# Patient Record
Sex: Female | Born: 1980 | Race: White | Hispanic: No | Marital: Single | Smoking: Never smoker
Health system: Southern US, Community
[De-identification: ages and names within clinical notes are randomized; demographics above are authoritative.]

---

## 2009-05-27 ENCOUNTER — Ambulatory Visit: Payer: Self-pay | Admitting: Internal Medicine

## 2009-05-27 ENCOUNTER — Inpatient Hospital Stay (HOSPITAL_COMMUNITY): Admission: EM | Admit: 2009-05-27 | Discharge: 2009-05-30 | Payer: Self-pay | Admitting: Emergency Medicine

## 2010-09-20 LAB — CBC
HCT: 34.8 % — ABNORMAL LOW (ref 36.0–46.0)
Hemoglobin: 15.4 g/dL — ABNORMAL HIGH (ref 12.0–15.0)
MCHC: 34.4 g/dL (ref 30.0–36.0)
MCV: 92.3 fL (ref 78.0–100.0)
MCV: 92.5 fL (ref 78.0–100.0)
MCV: 92.5 fL (ref 78.0–100.0)
Platelets: 171 10*3/uL (ref 150–400)
RBC: 3.77 MIL/uL — ABNORMAL LOW (ref 3.87–5.11)
RBC: 4.22 MIL/uL (ref 3.87–5.11)
RBC: 4.95 MIL/uL (ref 3.87–5.11)
RDW: 14.2 % (ref 11.5–15.5)
RDW: 14.5 % (ref 11.5–15.5)
WBC: 21 10*3/uL — ABNORMAL HIGH (ref 4.0–10.5)
WBC: 4.5 10*3/uL (ref 4.0–10.5)
WBC: 9.2 10*3/uL (ref 4.0–10.5)

## 2010-09-20 LAB — URINE CULTURE: Colony Count: NO GROWTH

## 2010-09-20 LAB — BASIC METABOLIC PANEL
BUN: 9 mg/dL (ref 6–23)
CO2: 18 mEq/L — ABNORMAL LOW (ref 19–32)
Calcium: 7.8 mg/dL — ABNORMAL LOW (ref 8.4–10.5)
Chloride: 111 mEq/L (ref 96–112)
Creatinine, Ser: 1 mg/dL (ref 0.4–1.2)
GFR calc Af Amer: >60 mL/min (ref >60)
Glucose, Bld: 107 mg/dL — ABNORMAL HIGH (ref 70–99)

## 2010-09-20 LAB — DIFFERENTIAL
Basophils Absolute: 0 10*3/uL (ref 0.0–0.1)
Basophils Relative: 0 % (ref 0–1)
Eosinophils Absolute: 0 10*3/uL (ref 0.0–0.7)
Eosinophils Absolute: 0 10*3/uL (ref 0.0–0.7)
Lymphs Abs: 0.6 10*3/uL — ABNORMAL LOW (ref 0.7–4.0)
Monocytes Absolute: 0 10*3/uL — ABNORMAL LOW (ref 0.1–1.0)
Monocytes Absolute: 0.4 10*3/uL (ref 0.1–1.0)
Monocytes Relative: 0 % — ABNORMAL LOW (ref 3–12)
Monocytes Relative: 2 % — ABNORMAL LOW (ref 3–12)
Neutrophils Relative %: 96 % — ABNORMAL HIGH (ref 43–77)
WBC Morphology: INCREASED

## 2010-09-20 LAB — LIPID PANEL
HDL: 32 mg/dL — ABNORMAL LOW (ref >39)
LDL Cholesterol: 36 mg/dL (ref 0–99)
Triglycerides: 30 mg/dL (ref <150)

## 2010-09-20 LAB — COMPREHENSIVE METABOLIC PANEL
ALT: 24 U/L (ref 0–35)
AST: 27 U/L (ref 0–37)
Albumin: 3.8 g/dL (ref 3.5–5.2)
Alkaline Phosphatase: 60 U/L (ref 39–117)
BUN: 19 mg/dL (ref 6–23)
CO2: 20 mEq/L (ref 19–32)
CO2: 23 mEq/L (ref 19–32)
Calcium: 7.4 mg/dL — ABNORMAL LOW (ref 8.4–10.5)
Chloride: 107 mEq/L (ref 96–112)
GFR calc non Af Amer: 42 mL/min — ABNORMAL LOW (ref >60)
GFR calc non Af Amer: 54 mL/min — ABNORMAL LOW (ref >60)
Glucose, Bld: 105 mg/dL — ABNORMAL HIGH (ref 70–99)
Potassium: 4.1 mEq/L (ref 3.5–5.1)
Sodium: 140 mEq/L (ref 135–145)
Total Bilirubin: 0.7 mg/dL (ref 0.3–1.2)
Total Bilirubin: 1.1 mg/dL (ref 0.3–1.2)

## 2010-09-20 LAB — CULTURE, BLOOD (ROUTINE X 2): Culture: NO GROWTH

## 2010-09-20 LAB — CALCIUM, URINE, RANDOM: Calcium, Ur: <1 mg/dL

## 2010-09-20 LAB — URINE MICROSCOPIC-ADD ON

## 2010-09-20 LAB — LACTIC ACID, PLASMA
Lactic Acid, Venous: 1.9 mmol/L (ref 0.5–2.2)
Lactic Acid, Venous: 6.7 mmol/L — ABNORMAL HIGH (ref 0.5–2.2)

## 2010-09-20 LAB — URINALYSIS, ROUTINE W REFLEX MICROSCOPIC
Bilirubin Urine: NEGATIVE
Specific Gravity, Urine: 1.014 (ref 1.005–1.030)
pH: 7.5 (ref 5.0–8.0)

## 2010-09-20 LAB — CK TOTAL AND CKMB (NOT AT ARMC): Total CK: 59 U/L (ref 7–177)

## 2010-09-20 LAB — OSMOLALITY, URINE: Osmolality, Ur: 326 mOsm/kg — ABNORMAL LOW (ref 390–1090)

## 2010-09-20 LAB — SODIUM, URINE, RANDOM: Sodium, Ur: 88 mEq/L

## 2011-01-19 ENCOUNTER — Other Ambulatory Visit (HOSPITAL_COMMUNITY)
Admission: RE | Admit: 2011-01-19 | Discharge: 2011-01-19 | Disposition: A | Discharge status: Home / Self Care | Payer: Medicare Other | Source: Ambulatory Visit | Attending: Obstetrics and Gynecology | Admitting: Obstetrics and Gynecology

## 2011-01-19 DIAGNOSIS — Z124 Encounter for screening for malignant neoplasm of cervix: Secondary | ICD-10-CM | POA: Insufficient documentation

## 2012-04-23 ENCOUNTER — Other Ambulatory Visit (HOSPITAL_COMMUNITY)
Admission: RE | Admit: 2012-04-23 | Discharge: 2012-04-23 | Disposition: A | Discharge status: Home / Self Care | Payer: Medicare Other | Source: Ambulatory Visit | Attending: Obstetrics and Gynecology | Admitting: Obstetrics and Gynecology

## 2012-04-23 DIAGNOSIS — Z124 Encounter for screening for malignant neoplasm of cervix: Secondary | ICD-10-CM | POA: Insufficient documentation

## 2012-09-17 ENCOUNTER — Telehealth: Payer: Self-pay | Admitting: Podiatry

## 2012-09-17 NOTE — Telephone Encounter (Signed)
Fax request for Pt..Ciclopirox 8 percent solution(Penlac)

## 2012-09-18 MED ORDER — CICLOPIROX 8 % EX SOLN: 1.0000 [drp] | Freq: Every day | CUTANEOUS | Status: DC

## 2012-10-23 ENCOUNTER — Encounter: Payer: Self-pay | Admitting: Podiatry

## 2012-10-23 ENCOUNTER — Ambulatory Visit (INDEPENDENT_AMBULATORY_CARE_PROVIDER_SITE_OTHER): Payer: Medicare Other | Admitting: Podiatry

## 2012-10-23 VITALS — BP 104/72 | HR 90 | Ht 61.0 in | Wt 120.0 lb

## 2012-10-23 DIAGNOSIS — M25571 Pain in right ankle and joints of right foot: Secondary | ICD-10-CM

## 2012-10-23 DIAGNOSIS — B351 Tinea unguium: Secondary | ICD-10-CM

## 2012-10-23 DIAGNOSIS — M25579 Pain in unspecified ankle and joints of unspecified foot: Secondary | ICD-10-CM | POA: Insufficient documentation

## 2012-10-23 NOTE — Progress Notes (Signed)
Subjective: 32 y.o. year old female patient presents accompanied by her care taker, for follow up on anti fungal treatment on right great toe nail. Patient lives in a group home. Still using Penlac daily and cleanse weakly.   Review of Systems - General ROS: negative for - chills, fatigue, fever, night sweats, sleep disturbance, weight gain or weight loss Ophthalmic ROS: negative ENT ROS: negative Allergy and Immunology ROS: negative Endocrine ROS: negative Breast ROS: negative for breast lumps Respiratory ROS: no cough, shortness of breath, or wheezing Cardiovascular ROS: no chest pain or dyspnea on exertion Gastrointestinal ROS: no abdominal pain, change in bowel habits, or black or bloody stools Musculoskeletal ROS: negative  Objective: Dermatologic: Thick yellow deformed nail on right great toe.   Vascular: Pedal pulses are all palpable. Orthopedic: Positive of mild bunion on left, asymptomatic.   Neurologic: All epicritic and tactile sensations grossly intact.  Assessment: Dystrophic mycotic nail right hallux shown improvement with Penlac treatment.  Treatment: All mycotic nails debrided.  Continue with Penlac treatment.  Return in 3 months or as needed.

## 2013-01-24 ENCOUNTER — Ambulatory Visit (INDEPENDENT_AMBULATORY_CARE_PROVIDER_SITE_OTHER): Payer: Medicare Other | Admitting: Podiatry

## 2013-01-24 DIAGNOSIS — B351 Tinea unguium: Secondary | ICD-10-CM

## 2013-01-24 NOTE — Progress Notes (Signed)
Subjective:  32 y.o. year old female patient presents accompanied by her care taker, for follow up on anti fungal treatment on right great toe nail.  Objective: Dermatologic: Thick yellow deformed nail on right great toe.  Vascular: Pedal pulses are all palpable.  Orthopedic: Positive of mild bunion on left, asymptomatic.  Neurologic: All epicritic and tactile sensations grossly intact.   Assessment:  Dystrophic mycotic nail right hallux shown improvement with Penlac treatment.   Treatment: All mycotic nails debrided.  Continue with Penlac treatment.  Return in 3 months or as needed.

## 2013-04-22 ENCOUNTER — Telehealth: Payer: Self-pay | Admitting: *Deleted

## 2013-04-22 NOTE — Telephone Encounter (Signed)
Dr. Raynald Kemp , Someone called for Myriam Jacobson and cancelled her appointment for this Friday 04/25/2013. They also requested we send a discontinue order for Toenail solution prescribed and fax to   Att: Cala Bradford @ [phone].

## 2013-04-23 ENCOUNTER — Encounter: Payer: Self-pay | Admitting: Podiatry

## 2013-04-25 ENCOUNTER — Ambulatory Visit: Payer: Medicare Other | Admitting: Podiatry

## 2014-04-22 LAB — PROCEDURE REPORT - SCANNED: PAP SMEAR: NEGATIVE

## 2014-11-11 ENCOUNTER — Ambulatory Visit: Payer: Medicare Other | Admitting: Podiatry

## 2014-12-14 ENCOUNTER — Ambulatory Visit (INDEPENDENT_AMBULATORY_CARE_PROVIDER_SITE_OTHER): Payer: Medicare Other | Admitting: Podiatry

## 2014-12-14 ENCOUNTER — Encounter: Payer: Self-pay | Admitting: Podiatry

## 2014-12-14 DIAGNOSIS — B351 Tinea unguium: Secondary | ICD-10-CM | POA: Diagnosis not present

## 2014-12-14 DIAGNOSIS — M79676 Pain in unspecified toe(s): Secondary | ICD-10-CM

## 2014-12-14 MED ORDER — KETOCONAZOLE 2 % EX CREA: 1.0000 "application " | TOPICAL_CREAM | Freq: Every day | CUTANEOUS | Status: DC

## 2014-12-14 NOTE — Progress Notes (Signed)
   Subjective:    Patient ID: Ferrari Zehm, female    DOB: 1981/05/22, 34 y.o.   MRN: [id number]  HPI 34 year old phenol presents the office today with complaints of painful, thick and, elongated toenails which she is able to trim herself. Denies any redness or drainage zone nail sites. She states that she has nail fungus which she suffers several years. She states that she has not other podiatrist and she presents been prescribed Penlac apparently however she does not remove her she used the medication or not. She also states that she has some itching in between her toes. No other complaints at this time in no acute changes since last appointment. Review of Systems  Endocrine: Positive for cold intolerance.       Objective:   Physical Exam Awake, alert, NAD, presents with caregiver DP/PT pulses palpable, CRT less than 3 seconds Protective sensation intact with Simms Weinstein monofilament, vibratory sensation intact, Achilles tendon reflex intact. Nails are hypertrophic, dystrophic, brittle, elongated, discolored 10. There is no surrounding erythema or drainage on nail sites. There is tenderness palpation overlying nails 1-5 bilaterally. There is dry, peeling skin interdigitally with slight erythematous base and subjectively the area does itch. No other areas of tenderness to bilateral lower extremity's. No overlying edema, erythema, increase in warmth bilaterally. MMT 5/5, ROM WNL No open lesions or pre-ulcerative lesions identified bilaterally No pain with calf compression, swelling, warmth, erythema.      Assessment & Plan:  34 year old female with symptomatic onychomycosis, tinea pedis -Treatment options discussed including all alternatives, risks, and complications -Nail sharply debrided 10 without complications/bleeding. Discussed. Treatment for onychomycosis. This time shows proceed with topical OTC treatment. Discussed fungi-nail or formula 3 -Prescribed  ketoconazole -Follow-up in 3 months or sooner if any problems are to arise. In the meantime I encouraged her to call the office with any questions, concerns, change in symptoms.  Ovid Curd, DPM

## 2014-12-14 NOTE — Patient Instructions (Signed)

## 2015-03-25 ENCOUNTER — Ambulatory Visit: Payer: Medicare Other | Admitting: Podiatry

## 2015-08-13 ENCOUNTER — Ambulatory Visit: Payer: Medicare Other | Admitting: Podiatry

## 2015-08-31 ENCOUNTER — Ambulatory Visit: Payer: Medicare Other | Admitting: Podiatry

## 2016-03-15 ENCOUNTER — Encounter: Payer: Self-pay | Admitting: *Deleted

## 2018-04-10 ENCOUNTER — Ambulatory Visit (INDEPENDENT_AMBULATORY_CARE_PROVIDER_SITE_OTHER): Payer: Medicare Other | Admitting: Podiatry

## 2018-04-10 VITALS — BP 112/77 | HR 108

## 2018-04-10 DIAGNOSIS — M79674 Pain in right toe(s): Secondary | ICD-10-CM

## 2018-04-10 DIAGNOSIS — M79675 Pain in left toe(s): Secondary | ICD-10-CM

## 2018-04-10 DIAGNOSIS — B353 Tinea pedis: Secondary | ICD-10-CM

## 2018-04-10 DIAGNOSIS — B351 Tinea unguium: Secondary | ICD-10-CM | POA: Diagnosis not present

## 2018-04-10 MED ORDER — CLOTRIMAZOLE 1 % EX CREA: TOPICAL_CREAM | CUTANEOUS | 1 refills | Status: DC

## 2018-04-10 NOTE — Progress Notes (Signed)
Subjective: Andrea Frank presents today for folllowup preventative foot care regarding painful mycotic toenails. She is from a group home and is accompanied by her caregiver on today's visit. When her toenails are elongated, she has pain aggravated when wearing enclosed shoe gear. This pain is relieved with periodic professional debridement.  ROS: Endocrine: Positive for cold intolerance  Objective: Vitals:   04/10/18 1107  BP: 112/77  Pulse: (!) 108   Examination: 37 y.o. Caucasian female, obese, in NAD. AA in NAD. Pleasant and cooperative.  Vascular Examination: Capillary refill time <3 seconds x 10 digits Dorsalis pedis and posterior tibial pulses present b/l No digital hair x 10 digits Skin temperature warm to warm b/l  Dermatological Examination: Skin with normal turgor and tone b/l Diffuse scaling noted plantar and peripheral aspect of both feet Toenails 1-5 b/l discolored, thick, dystrophic with subungual debris and pain with palpation to nailbeds due to thickness of nails.  Musculoskeletal: Muscle strength 5/5 to all LE muscle groups  Neurological: Sensation intact with 10 gram monofilament. Vibratory sensation intact.  Assessment: Painful onychomycosis toenails 1-5 b/l  Tinea pedis b/l  Plan: 1. Discussed tinea pedis and topical treatment options. Rx sent to pharmacy for Clotrimazole Cream 1% to be applied to both feet bid for 4 weeks. 2. Toenails 1-5 b/l were debrided in length and girth without iatrogenic bleeding. 3. Patient to continue soft, supportive shoe gear 4. Patient to report any pedal injuries to medical professional immediately. 5. Follow up 3 months. Patient/POA to call should there be a concern in the interim.

## 2018-04-11 ENCOUNTER — Encounter: Payer: Self-pay | Admitting: Podiatry

## 2018-07-11 ENCOUNTER — Ambulatory Visit: Payer: Medicare Other | Admitting: Podiatry

## 2018-07-11 ENCOUNTER — Other Ambulatory Visit: Payer: Self-pay | Admitting: Family Medicine

## 2018-07-11 DIAGNOSIS — Z1231 Encounter for screening mammogram for malignant neoplasm of breast: Secondary | ICD-10-CM

## 2018-08-14 ENCOUNTER — Ambulatory Visit
Admission: RE | Admit: 2018-08-14 | Discharge: 2018-08-14 | Disposition: A | Discharge status: Home / Self Care | Payer: Medicare Other | Source: Ambulatory Visit | Attending: Family Medicine | Admitting: Family Medicine

## 2018-08-14 DIAGNOSIS — Z1231 Encounter for screening mammogram for malignant neoplasm of breast: Secondary | ICD-10-CM

## 2018-12-10 ENCOUNTER — Encounter: Payer: Self-pay | Admitting: Podiatry

## 2018-12-10 ENCOUNTER — Ambulatory Visit (INDEPENDENT_AMBULATORY_CARE_PROVIDER_SITE_OTHER): Payer: Medicare Other | Admitting: Podiatry

## 2018-12-10 ENCOUNTER — Other Ambulatory Visit: Payer: Self-pay

## 2018-12-10 VITALS — Temp 98.0°F

## 2018-12-10 DIAGNOSIS — M79675 Pain in left toe(s): Secondary | ICD-10-CM | POA: Diagnosis not present

## 2018-12-10 DIAGNOSIS — B353 Tinea pedis: Secondary | ICD-10-CM

## 2018-12-10 DIAGNOSIS — M79674 Pain in right toe(s): Secondary | ICD-10-CM | POA: Diagnosis not present

## 2018-12-10 DIAGNOSIS — B351 Tinea unguium: Secondary | ICD-10-CM

## 2018-12-10 MED ORDER — CICLOPIROX OLAMINE 0.77 % EX CREA
TOPICAL_CREAM | Freq: Two times a day (BID) | CUTANEOUS | 0 refills | Status: DC
Start: 2018-12-10 — End: 2019-03-18

## 2018-12-10 NOTE — Patient Instructions (Signed)

## 2018-12-19 NOTE — Progress Notes (Signed)
Subjective:  Andrea Frank presents to clinic today with cc of  painful, thick, discolored, elongated toenails 1-5 b/l that become tender and cannot cut because of thickness. Pain is aggravated when wearing enclosed shoe gear.  Leonard Downing, MD is her PCP.   Medications reviewed.  No Known Allergies   Objective: Vitals:   12/10/18 1143  Temp: 98 F (36.7 C)    Physical Examination:  Vascular Examination: Capillary refill time <3 seconds x 10 digits.  Palpable DP/PT pulses b/l.  Digital hair absent b/l.  No edema noted b/l.  Skin temperature gradient WNL b/l.  Dermatological Examination: Skin with normal turgor, texture and tone b/l.  No open wounds b/l.  No interdigital macerations noted b/l.  Elongated, thick, discolored brittle toenails with subungual debris and pain on dorsal palpation of nailbeds 1-5 b/l.  Diffuse scaling noted peripherally and plantarly b/l feet with mild foot odor.  No interdigital macerations.  No blisters, no weeping. No signs of secondary bacterial infection noted.  Musculoskeletal Examination: Muscle strength 5/5 to all muscle groups b/l  No pain, crepitus or joint discomfort with active/passive ROM.  Neurological Examination: Sensation intact 5/5 b/l with 10 gram monofilament.  Vibratory sensation intact b/l.  Proprioceptive sensation intact b/l.  Assessment: Mycotic nail infection with pain 1-5 b/l Tinea pedis b/l.  Plan: 1. Toenails 1-5 b/l were debrided in length and girth without iatrogenic laceration. 2. For tinea pedis, will discontinue Clotrimazole Cream and start her on Loprox Cream 0.77% to be applied to both feet bid. 3.  Continue soft, supportive shoe gear daily. 4.   Report any pedal injuries to medical professional. 5.  Follow up 3 months. 6.  Patient/POA to call should there be a question/concern in there interim.

## 2019-01-04 ENCOUNTER — Encounter: Payer: Self-pay | Admitting: Emergency Medicine

## 2019-01-04 ENCOUNTER — Other Ambulatory Visit: Payer: Self-pay

## 2019-01-04 ENCOUNTER — Emergency Department
Admission: EM | Admit: 2019-01-04 | Discharge: 2019-01-04 | Disposition: A | Discharge status: Home / Self Care | Payer: Medicare Other | Attending: Emergency Medicine | Admitting: Emergency Medicine

## 2019-01-04 DIAGNOSIS — Z20828 Contact with and (suspected) exposure to other viral communicable diseases: Secondary | ICD-10-CM | POA: Diagnosis not present

## 2019-01-04 DIAGNOSIS — R1110 Vomiting, unspecified: Secondary | ICD-10-CM

## 2019-01-04 DIAGNOSIS — Z79899 Other long term (current) drug therapy: Secondary | ICD-10-CM | POA: Insufficient documentation

## 2019-01-04 LAB — POCT PREGNANCY, URINE: Preg Test, Ur: NEGATIVE

## 2019-01-04 LAB — SARS CORONAVIRUS 2 BY RT PCR (HOSPITAL ORDER, PERFORMED IN ~~LOC~~ HOSPITAL LAB): SARS Coronavirus 2: NEGATIVE

## 2019-01-04 NOTE — ED Triage Notes (Signed)
Patient from [facility] with caregiver due to history of emesis x 1 yesterday and x1 this am. Was able to eat lunch with no further emesis. Denies abdominal pain. Possible fever yesterday per caregiver. Afebrile at present.

## 2019-01-04 NOTE — ED Notes (Signed)
Pt ambulatory from triage, A&Ox4, reports 2 episodes of emesis since yesterday, NAD noted. Dr. Jari Pigg at bedside. Group home owner at bedside

## 2019-01-04 NOTE — Discharge Instructions (Signed)
Person Under Monitoring Name: Andrea Frank  Location: [place] [address]   Infection Prevention Recommendations for Individuals Confirmed to have, or Being Evaluated for, 2019 Novel Coronavirus (COVID-19) Infection Who Receive Care at Home  Individuals who are confirmed to have, or are being evaluated for, COVID-19 should follow the prevention steps below until a healthcare provider or local or state health department says they can return to normal activities.  Stay home except to get medical care You should restrict activities outside your home, except for getting medical care. Do not go to work, school, or public areas, and do not use public transportation or taxis.  Call ahead before visiting your doctor Before your medical appointment, call the healthcare provider and tell them that you have, or are being evaluated for, COVID-19 infection. This will help the healthcare providers office take steps to keep other people from getting infected. Ask your healthcare provider to call the local or state health department.  Monitor your symptoms Seek prompt medical attention if your illness is worsening (e.g., difficulty breathing). Before going to your medical appointment, call the healthcare provider and tell them that you have, or are being evaluated for, COVID-19 infection. Ask your healthcare provider to call the local or state health department.  Wear a facemask You should wear a facemask that covers your nose and mouth when you are in the same room with other people and when you visit a healthcare provider. People who live with or visit you should also wear a facemask while they are in the same room with you.  Separate yourself from other people in your home As much as possible, you should stay in a different room from other people in your home. Also, you should use a separate bathroom, if available.  Avoid sharing household items You should  not share dishes, drinking glasses, cups, eating utensils, towels, bedding, or other items with other people in your home. After using these items, you should wash them thoroughly with soap and water.  Cover your coughs and sneezes Cover your mouth and nose with a tissue when you cough or sneeze, or you can cough or sneeze into your sleeve. Throw used tissues in a lined trash can, and immediately wash your hands with soap and water for at least 20 seconds or use an alcohol-based hand rub.  Wash your [facility] your hands often and thoroughly with soap and water for at least 20 seconds. You can use an alcohol-based hand sanitizer if soap and water are not available and if your hands are not visibly dirty. Avoid touching your eyes, nose, and mouth with unwashed hands.   Prevention Steps for Caregivers and Household Members of Individuals Confirmed to have, or Being Evaluated for, COVID-19 Infection Being Cared for in the Home  If you live with, or provide care at home for, a person confirmed to have, or being evaluated for, COVID-19 infection please follow these guidelines to prevent infection:  Follow healthcare providers instructions Make sure that you understand and can help the patient follow any healthcare provider instructions for all care.  Provide for the patients basic needs You should help the patient with basic needs in the home and provide support for getting groceries, prescriptions, and other personal needs.  Monitor the patients symptoms If they are getting sicker, call his or her medical provider and tell them that the patient has, or is being evaluated for, COVID-19 infection. This will help the healthcare providers office  take steps to keep other people from getting infected. Ask the healthcare provider to call the local or state health department.  Limit the number of people who have contact with the patient If possible, have only one caregiver for the  patient. Other household members should stay in another home or place of residence. If this is not possible, they should stay in another room, or be separated from the patient as much as possible. Use a separate bathroom, if available. Restrict visitors who do not have an essential need to be in the home.  Keep older adults, very young children, and other sick people away from the patient Keep older adults, very young children, and those who have compromised immune systems or chronic health conditions away from the patient. This includes people with chronic heart, lung, or kidney conditions, diabetes, and cancer.  Ensure good ventilation Make sure that shared spaces in the home have good air flow, such as from an air conditioner or an opened window, weather permitting.  Wash your hands often Wash your hands often and thoroughly with soap and water for at least 20 seconds. You can use an alcohol based hand sanitizer if soap and water are not available and if your hands are not visibly dirty. Avoid touching your eyes, nose, and mouth with unwashed hands. Use disposable paper towels to dry your hands. If not available, use dedicated cloth towels and replace them when they become wet.  Wear a facemask and gloves Wear a disposable facemask at all times in the room and gloves when you touch or have contact with the patients blood, body fluids, and/or secretions or excretions, such as sweat, saliva, sputum, nasal mucus, vomit, urine, or feces.  Ensure the mask fits over your nose and mouth tightly, and do not touch it during use. Throw out disposable facemasks and gloves after using them. Do not reuse. Wash your hands immediately after removing your facemask and gloves. If your personal clothing becomes contaminated, carefully remove clothing and launder. Wash your hands after handling contaminated clothing. Place all used disposable facemasks, gloves, and other waste in a lined container before  disposing them with other household waste. Remove gloves and wash your hands immediately after handling these items.  Do not share dishes, glasses, or other household items with the patient Avoid sharing household items. You should not share dishes, drinking glasses, cups, eating utensils, towels, bedding, or other items with a patient who is confirmed to have, or being evaluated for, COVID-19 infection. After the person uses these items, you should wash them thoroughly with soap and water.  Wash laundry thoroughly Immediately remove and wash clothes or bedding that have blood, body fluids, and/or secretions or excretions, such as sweat, saliva, sputum, nasal mucus, vomit, urine, or feces, on them. Wear gloves when handling laundry from the patient. Read and follow directions on labels of laundry or clothing items and detergent. In general, wash and dry with the warmest temperatures recommended on the label.  Clean all areas the individual has used often Clean all touchable surfaces, such as counters, tabletops, doorknobs, bathroom fixtures, toilets, phones, keyboards, tablets, and bedside tables, every day. Also, clean any surfaces that may have blood, body fluids, and/or secretions or excretions on them. Wear gloves when cleaning surfaces the patient has come in contact with. Use a diluted bleach solution (e.g., dilute bleach with 1 part bleach and 10 parts water) or a household disinfectant with a label that says EPA-registered for coronaviruses. To make a bleach  solution at home, add 1 tablespoon of bleach to 1 quart (4 cups) of water. For a larger supply, add  cup of bleach to 1 gallon (16 cups) of water. Read labels of cleaning products and follow recommendations provided on product labels. Labels contain instructions for safe and effective use of the cleaning product including precautions you should take when applying the product, such as wearing gloves or eye protection and making sure you  have good ventilation during use of the product. Remove gloves and wash hands immediately after cleaning.  Monitor yourself for signs and symptoms of illness Caregivers and household members are considered close contacts, should monitor their health, and will be asked to limit movement outside of the home to the extent possible. Follow the monitoring steps for close contacts listed on the symptom monitoring form.   ? If you have additional questions, contact your local health department or call the epidemiologist on call at [phone] (available 24/7). ? This guidance is subject to change. For the most up-to-date guidance from [facility], please refer to their website: [email or web address]

## 2019-01-04 NOTE — ED Provider Notes (Signed)
[facility] Emergency Department Provider Note  ____________________________________________   First MD Initiated Contact with Patient 01/04/19 1750     (approximate)  I have reviewed the triage vital signs and the nursing notes.   HISTORY  Chief Complaint Emesis    HPI Andrea Frank is a 38 y.o. female with Down syndrome who stays at the [facility] who presents with caregiver due to vomiting. Patient had 1 episode of vomiting yesterday maybe 1 today.  Patient was able to eat lunch without further emesis.  Patient is not having any abdominal pain.  There was a report of some possibility of having a fever but unclear if this was accurate.  Patient was brought due to the concerns of caregiver that she may have coronavirus.  They are requesting coronavirus testing.  Patient herself says she is feeling fine.  Patient laughs when I tell her she is making my job too easy.  She denies any abdominal pain, urinary symptoms, chest pain, shortness of breath.     History reviewed. No pertinent past medical history.  Patient Active Problem List   Diagnosis Date Noted   Pain in joint, ankle and foot 10/23/2012   Onychomycosis due to dermatophyte 10/23/2012    History reviewed. No pertinent surgical history.  Prior to Admission medications   Medication Sig Start Date End Date Taking? Authorizing Provider  benzoyl peroxide-erythromycin [facility]) gel  08/19/18   [provider]  ciclopirox (LOPROX) 0.77 % cream Apply topically 2 (two) times daily. 12/10/18   Marzetta Board, DPM  ciclopirox (PENLAC) 8 % solution Apply 1 drop topically at bedtime. Apply over nail and surrounding skin. Apply daily over previous coat. After seven (7) days, may remove with alcohol and continue cycle. 09/18/12   Sheard, Myeong O, DPM  clindamycin-benzoyl peroxide (BENZACLIN) gel  11/27/18   [provider]  clotrimazole (LOTRIMIN) 1 % cream Apply to both  feet and between toes twice daily 04/10/18   Marzetta Board, DPM  [employer] Extract (T/GEL EXTRA STRENGTH EX) Apply topically.    [provider]  Ergocalciferol (VITAMIN D2 PO) Take by mouth. 2 day weekly    [provider]  hydrocortisone 2.5 % cream  11/27/18   [provider]  ketoconazole (NIZORAL) 2 % cream Apply 1 application topically daily. 12/14/14   Trula Slade, DPM  medroxyPROGESTERone (DEPO-PROVERA) 150 MG/ML injection Inject 150 mg into the muscle every 3 (three) months.  07/16/12   [provider]  Selenium Sulfide (SELSUN BLUE MEDICATED EX) Apply topically.    [provider]    Allergies Patient has no known allergies.  No family history on file.  Social History Social History   Tobacco Use   Smoking status: Never Smoker   Smokeless tobacco: Never Used  Substance Use Topics   Alcohol use: Not on file   Drug use: Not on file      Review of Systems Constitutional: No fever/chills Eyes: No visual changes. ENT: No sore throat. Cardiovascular: Denies chest pain. Respiratory: Denies shortness of breath. Gastrointestinal: No abdominal pain.  No nausea, vomiting that has resolved..  No diarrhea.  No constipation. Genitourinary: Negative for dysuria. Musculoskeletal: Negative for back pain. Skin: Negative for rash. Neurological: Negative for headaches, focal weakness or numbness. All other ROS negative ____________________________________________   PHYSICAL EXAM:  VITAL SIGNS: ED Triage Vitals  Enc Vitals Group     BP 01/04/19 1458 103/65     Pulse Rate 01/04/19 1458 (!)  104     Resp 01/04/19 1458 18     Temp 01/04/19 1458 98.5 F (36.9 C)     Temp Source 01/04/19 1458 Oral     SpO2 01/04/19 1458 98 %     Weight 01/04/19 1502 176 lb 12.9 oz (80.2 kg)     Height --      Head Circumference --      Peak Flow --      Pain Score 01/04/19 1502 0     Pain Loc --      Pain Edu? --      Excl. in GC?  --     Constitutional: Alert and oriented. Well appearing and in no acute distress.  Features consistent with known Down syndrome. Eyes: Conjunctivae are normal. EOMI. Head: Atraumatic. Nose: No congestion/rhinnorhea. Mouth/Throat: Mucous membranes are moist.   Neck: No stridor. Trachea Midline. FROM Cardiovascular: Slightly tachycardic.  Regular rhythm. Grossly normal heart sounds.  Good peripheral circulation. Respiratory: Normal respiratory effort.  No retractions. Lungs CTAB. Gastrointestinal: Soft and nontender. No distention. No abdominal bruits.  Musculoskeletal: No lower extremity tenderness nor edema.  No joint effusions. Neurologic:  Normal speech and language. No gross focal neurologic deficits are appreciated.  Skin:  Skin is warm, dry and intact. No rash noted. Psychiatric: Mood and affect are normal. Speech and behavior are normal. GU: Deferred   ____________________________________________   LABS (all labs ordered are listed, but only abnormal results are displayed)  Labs Reviewed  SARS CORONAVIRUS 2 (HOSPITAL ORDER, PERFORMED IN [facility])  POCT PREGNANCY, URINE    ____________________________________________   PROCEDURES  Procedure(s) performed (including Critical Care):  Procedures   ____________________________________________   INITIAL IMPRESSION / ASSESSMENT AND PLAN / ED COURSE  Andrea Frank was evaluated in Emergency Department on 01/04/2019 for the symptoms described in the history of present illness. She was evaluated in the context of the global COVID-19 pandemic, which necessitated consideration that the patient might be at risk for infection with the SARS-CoV-2 virus that causes COVID-19. Institutional protocols and algorithms that pertain to the evaluation of patients at risk for COVID-19 are in a state of rapid change based on information released by regulatory bodies including the CDC and federal and state organizations. These  policies and algorithms were followed during the patient's care in the ED.    Patient is very well-appearing and laughing.  Patient has no abdominal tenderness to suggest abdominal infection. No chest pain to suggest ACS. No SOB to suggest PE. Patient is tolerating p.o. here in the emergency room.  Patient was slightly tachycardic but again so well-appearing and repeat HR normal. Offered lab testing to evaluate for electrolyte abnormalities, dehydration, infection however given patient is well-appearing caregiver and I opted to hold off on IV. No fever here as well and has not had recent ibuprofen/tylenol. Caregiver is most interested in getting a coronavirus testing and is hoping to be able to leave before the results come back.   6:18 PM re-evaluated tolerating PO. Urine test negative. Continues to look well.  No abd pain. Low suspicion for acute pathology.   Cover test negative  I discussed the provisional nature of ED diagnosis, the treatment so far, the ongoing plan of care, follow up appointments and return precautions with the patient and any family or support people present. They expressed understanding and agreed with the plan, discharged home.          ____________________________________________   FINAL CLINICAL IMPRESSION(S) / ED DIAGNOSES  Final diagnoses:  Intractable vomiting, presence of nausea not specified, unspecified vomiting type      MEDICATIONS GIVEN DURING THIS VISIT:  Medications - No data to display   ED Discharge Orders    None       Note:  This document was prepared using Dragon voice recognition software and may include unintentional dictation errors.   Concha SeFunke, Sophy Mesler E, MD 01/05/19 [id number]

## 2019-01-04 NOTE — ED Notes (Signed)
Pt moved to rm4.

## 2019-02-18 ENCOUNTER — Telehealth: Payer: Self-pay | Admitting: *Deleted

## 2019-02-18 NOTE — Telephone Encounter (Signed)
[facility] - Ed states pt received Loprox prescription on 12/10/2018, and is in a adult care facility, they will not stop the product without the doctors order.

## 2019-02-21 ENCOUNTER — Telehealth: Payer: Self-pay | Admitting: Podiatry

## 2019-02-21 DIAGNOSIS — B353 Tinea pedis: Secondary | ICD-10-CM

## 2019-02-21 MED ORDER — CLOTRIMAZOLE 1 % EX CREA
TOPICAL_CREAM | CUTANEOUS | 0 refills | Status: DC
Start: 2019-02-21 — End: 2019-03-18

## 2019-02-21 NOTE — Addendum Note (Signed)
Addended by: Harriett Sine D on: 02/21/2019 01:06 PM   Modules accepted: Orders

## 2019-02-21 NOTE — Telephone Encounter (Signed)
Pt called requesting refill on clotrimazole 1% cream

## 2019-03-18 ENCOUNTER — Encounter: Payer: Self-pay | Admitting: Podiatry

## 2019-03-18 ENCOUNTER — Ambulatory Visit (INDEPENDENT_AMBULATORY_CARE_PROVIDER_SITE_OTHER): Payer: Medicare Other | Admitting: Podiatry

## 2019-03-18 ENCOUNTER — Other Ambulatory Visit: Payer: Self-pay

## 2019-03-18 DIAGNOSIS — M79675 Pain in left toe(s): Secondary | ICD-10-CM

## 2019-03-18 DIAGNOSIS — M79674 Pain in right toe(s): Secondary | ICD-10-CM

## 2019-03-18 DIAGNOSIS — B351 Tinea unguium: Secondary | ICD-10-CM

## 2019-03-18 DIAGNOSIS — B353 Tinea pedis: Secondary | ICD-10-CM

## 2019-03-18 MED ORDER — CLOTRIMAZOLE-BETAMETHASONE 1-0.05 % EX CREA: TOPICAL_CREAM | CUTANEOUS | 1 refills | Status: DC

## 2019-03-18 NOTE — Patient Instructions (Signed)
Athlete's Foot  Athlete's foot (tinea pedis) is a fungal infection of the skin on your feet. It often occurs on the skin that is between or underneath the toes. It can also occur on the soles of your feet. The infection can spread from person to person (is contagious). It can also spread when a person's bare feet come in contact with the fungus on shower floors or on items such as shoes. What are the causes? This condition is caused by a fungus that grows in warm, moist places. You can get athlete's foot by sharing shoes, shower stalls, towels, and wet floors with someone who is infected. Not washing your feet or changing your socks often enough can also lead to athlete's foot. What increases the risk? This condition is more likely to develop in:  Men.  People who have a weak body defense system (immune system).  People who have diabetes.  People who use public showers, such as at a gym.  People who wear heavy-duty shoes, such as industrial or military shoes.  Seasons with warm, humid weather. What are the signs or symptoms? Symptoms of this condition include:  Itchy areas between your toes or on the soles of your feet.  White, flaky, or scaly areas between your toes or on the soles of your feet.  Very itchy small blisters between your toes or on the soles of your feet.  Small cuts in your skin. These cuts can become infected.  Thick or discolored toenails. How is this diagnosed? This condition may be diagnosed with a physical exam and a review of your medical history. Your health care provider may also take a skin or toenail sample to examine under a microscope. How is this treated? This condition is treated with antifungal medicines. These may be applied as powders, ointments, or creams. In severe cases, an oral antifungal medicine may be given. Follow these instructions at home: Medicines  Apply or take over-the-counter and prescription medicines only as told by your health  care provider.  Apply your antifungal medicine as told by your health care provider. Do not stop using the antifungal even if your condition improves. Foot care  Do not scratch your feet.  Keep your feet dry: ? Wear cotton or wool socks. Change your socks every day or if they become wet. ? Wear shoes that allow air to flow, such as sandals or canvas tennis shoes.  Wash and dry your feet, including the area between your toes. Also, wash and dry your feet: ? Every day or as told by your health care provider. ? After exercising. General instructions  Do not let others use towels, shoes, nail clippers, or other personal items that touch your feet.  Protect your feet by wearing sandals in wet areas, such as locker rooms and shared showers.  Keep all follow-up visits as told by your health care provider. This is important.  If you have diabetes, keep your blood sugar under control. Contact a health care provider if:  You have a fever.  You have swelling, soreness, warmth, or redness in your foot.  Your feet are not getting better with treatment.  Your symptoms get worse.  You have new symptoms. Summary  Athlete's foot (tinea pedis) is a fungal infection of the skin on your feet. It often occurs on skin that is between or underneath the toes.  This condition is caused by a fungus that grows in warm, moist places.  Symptoms include white, flaky, or scaly areas between   your toes or on the soles of your feet.  This condition is treated with antifungal medicines.  Keep your feet clean. Always dry them thoroughly. This information is not intended to replace advice given to you by your health care provider. Make sure you discuss any questions you have with your health care provider. Document Released: 06/02/2000 Document Revised: 05/31/2017 Document Reviewed: 03/26/2017 Elsevier Patient Education  2020 [employer]   Onychomycosis/Fungal Toenails  WHAT IS IT? An infection  that lies within the keratin of your nail plate that is caused by a fungus.  WHY ME? Fungal infections affect all ages, sexes, races, and creeds.  There may be many factors that predispose you to a fungal infection such as age, coexisting medical conditions such as diabetes, or an autoimmune disease; stress, medications, fatigue, genetics, etc.  Bottom line: fungus thrives in a warm, moist environment and your shoes offer such a location.  IS IT CONTAGIOUS? Theoretically, yes.  You do not want to share shoes, nail clippers or files with someone who has fungal toenails.  Walking around barefoot in the same room or sleeping in the same bed is unlikely to transfer the organism.  It is important to realize, however, that fungus can spread easily from one nail to the next on the same foot.  HOW DO WE TREAT THIS?  There are several ways to treat this condition.  Treatment may depend on many factors such as age, medications, pregnancy, liver and kidney conditions, etc.  It is best to ask your doctor which options are available to you.  1. No treatment.   Unlike many other medical concerns, you can live with this condition.  However for many people this can be a painful condition and may lead to ingrown toenails or a bacterial infection.  It is recommended that you keep the nails cut short to help reduce the amount of fungal nail. 2. Topical treatment.  These range from herbal remedies to prescription strength nail lacquers.  About 40-50% effective, topicals require twice daily application for approximately 9 to 12 months or until an entirely new nail has grown out.  The most effective topicals are medical grade medications available through physicians offices. 3. Oral antifungal medications.  With an 80-90% cure rate, the most common oral medication requires 3 to 4 months of therapy and stays in your system for a year as the new nail grows out.  Oral antifungal medications do require blood work to make sure it is  a safe drug for you.  A liver function panel will be performed prior to starting the medication and after the first month of treatment.  It is important to have the blood work performed to avoid any harmful side effects.  In general, this medication safe but blood work is required. 4. Laser Therapy.  This treatment is performed by applying a specialized laser to the affected nail plate.  This therapy is noninvasive, fast, and non-painful.  It is not covered by insurance and is therefore, out of pocket.  The results have been very good with a 80-95% cure rate.  The [facility] is the only practice in the area to offer this therapy. 5. Permanent Nail Avulsion.  Removing the entire nail so that a new nail will not grow back. 

## 2019-03-19 NOTE — Progress Notes (Signed)
Subjective: Andrea Frank is seen today for follow up painful, elongated, thickened toenails 1-5 b/l feet that she cannot cut. Pain interferes with daily activities. Aggravating factor includes wearing enclosed shoe gear and relieved with periodic debridement.  Andrea Frank resides in a group home. Caretaker is   Current Outpatient Medications on File Prior to Visit  Medication Sig  . benzoyl peroxide-erythromycin (BENZAMYCIN) gel   . ciclopirox (PENLAC) 8 % solution Apply 1 drop topically at bedtime. Apply over nail and surrounding skin. Apply daily over previous coat. After seven (7) days, may remove with alcohol and continue cycle.  . clindamycin-benzoyl peroxide (BENZACLIN) gel   . [employer] Extract (T/GEL EXTRA STRENGTH EX) Apply topically.  . Ergocalciferol (VITAMIN D2 PO) Take by mouth. 2 day weekly  . hydrocortisone 2.5 % cream   . medroxyPROGESTERone (DEPO-PROVERA) 150 MG/ML injection Inject 150 mg into the muscle every 3 (three) months.   . Selenium Sulfide (SELSUN BLUE MEDICATED EX) Apply topically.   No current facility-administered medications on file prior to visit.      No Known Allergies   Objective:  Vascular Examination: Capillary refill time immediate x 10 digits.  Dorsalis pedis present b/l.  Posterior tibial pulses present b/l.  Digital hair  present x 10 digits.  Skin temperature gradient WNL b/l.   Dermatological Examination: Skin with normal turgor, texture and tone b/l  Toenails 1-5 b/l discolored, thick, dystrophic with subungual debris and pain with palpation to nailbeds due to thickness of nails.  Diffuse scaling noted peripherally and plantarly b/l feet with mild foot odor.  No interdigital macerations.  No blisters, no weeping. No signs of secondary bacterial infection noted.  Musculoskeletal: Muscle strength 5/5 to all LE muscle groups  No gross bony deformities b/l.  No pain, crepitus or joint limitation noted with ROM.   Neurological  Examination: Protective sensation intact with 10 gram monofilament bilaterally.  Epicritic sensation present bilaterally.  Vibratory sensation intact bilaterally.   Assessment: Painful onychomycosis toenails 1-5 b/l  Tinea pedis b/l non-responsive to Clotrimazole Cream  Plan: 1. Toenails 1-5 b/l were debrided in length and girth without iatrogenic bleeding. 2. Discontinue Clotrimazole Cream. 3. Will add steroid component to antifungal for her tinea pedis. Rx was sent for Lotrisone Cream to be applied to both feet and between toes bid for 6 weeks. 4. Discussed tips for cleaning bathtub/shower between residents. Also spray shoes with disinfectant.  5. Patient to continue soft, supportive shoe gear. 6. Patient to report any pedal injuries to medical professional immediately. 7. Follow up 3 months.  8. Patient/POA to call should there be a concern in the interim.

## 2019-04-09 ENCOUNTER — Other Ambulatory Visit: Payer: Self-pay | Admitting: Podiatry

## 2019-04-09 DIAGNOSIS — B353 Tinea pedis: Secondary | ICD-10-CM

## 2019-05-08 ENCOUNTER — Other Ambulatory Visit: Payer: Self-pay

## 2019-05-08 ENCOUNTER — Encounter (HOSPITAL_COMMUNITY): Payer: Self-pay

## 2019-05-08 ENCOUNTER — Ambulatory Visit (HOSPITAL_COMMUNITY)
Admission: EM | Admit: 2019-05-08 | Discharge: 2019-05-08 | Disposition: A | Discharge status: Home / Self Care | Payer: Medicare Other | Attending: Internal Medicine | Admitting: Internal Medicine

## 2019-05-08 DIAGNOSIS — H1033 Unspecified acute conjunctivitis, bilateral: Secondary | ICD-10-CM

## 2019-05-08 MED ORDER — AMOXICILLIN-POT CLAVULANATE 875-125 MG PO TABS: 1.0000 | ORAL_TABLET | Freq: Two times a day (BID) | ORAL | 0 refills | Status: AC

## 2019-05-08 MED ORDER — ERYTHROMYCIN 5 MG/GM OP OINT: TOPICAL_OINTMENT | OPHTHALMIC | 0 refills | Status: DC

## 2019-05-08 NOTE — ED Provider Notes (Signed)
[place]    CSN: [id number] Arrival date & time: 05/08/19  1139      History   Chief Complaint Chief Complaint  Patient presents with  . Conjunctivitis    HPI Andrea Frank is a 38 y.o. female history of Down syndrome, presenting today for evaluation of conjunctivitis.  Patient woke up yesterday with red eyes and drainage.  Symptoms persisted into today and have slightly worsened.  She denies changes in vision.  Denies contact use.  States that she used to wear glasses, but does not anymore.  She denies photophobia.  She denies any injury or trauma.  Patient lives in a group home.  Denies close contacts with similar symptoms.  Denies ear pain.  Denies fevers.  HPI  History reviewed. No pertinent past medical history.  Patient Active Problem List   Diagnosis Date Noted  . Pain in joint, ankle and foot 10/23/2012  . Onychomycosis due to dermatophyte 10/23/2012    History reviewed. No pertinent surgical history.  OB History   No obstetric history on file.      Home Medications    Prior to Admission medications   Medication Sig Start Date End Date Taking? Authorizing Provider  amoxicillin-clavulanate (AUGMENTIN) 875-125 MG tablet Take 1 tablet by mouth every 12 (twelve) hours for 7 days. 05/08/19 05/15/19  Illeana Edick C, PA-C  benzoyl peroxide-erythromycin (BENZAMYCIN) gel  08/19/18   [provider]  ciclopirox (PENLAC) 8 % solution Apply 1 drop topically at bedtime. Apply over nail and surrounding skin. Apply daily over previous coat. After seven (7) days, may remove with alcohol and continue cycle. 09/18/12   Sheard, Myeong O, DPM  clindamycin-benzoyl peroxide (BENZACLIN) gel  11/27/18   [provider]  clotrimazole-betamethasone (LOTRISONE) cream APPLY TO BOTH FEET AND BETWEEN TOES TWICE DAILY FOR 6 WEEKS. 04/10/19   Marzetta Board, DPM  Coal Tar Extract (T/GEL EXTRA STRENGTH EX) Apply topically.    [provider]   Ergocalciferol (VITAMIN D2 PO) Take by mouth. 2 day weekly    [provider]  erythromycin ophthalmic ointment Place a 1/2 inch ribbon of ointment into the lower eyelid 4 times daily x 1 week 05/08/19   Jaiyden Laur, [place] C, PA-C  hydrocortisone 2.5 % cream  11/27/18   [provider]  medroxyPROGESTERone (DEPO-PROVERA) 150 MG/ML injection Inject 150 mg into the muscle every 3 (three) months.  07/16/12   [provider]  Selenium Sulfide (SELSUN BLUE MEDICATED EX) Apply topically.    [provider]    Family History History reviewed. No pertinent family history.  Social History Social History   Tobacco Use  . Smoking status: Never Smoker  . Smokeless tobacco: Never Used  Substance Use Topics  . Alcohol use: Never    Frequency: Never  . Drug use: Never     Allergies   Patient has no known allergies.   Review of Systems Review of Systems  Constitutional: Negative for activity change, appetite change, chills, fatigue and fever.  HENT: Negative for congestion, ear pain, rhinorrhea, sinus pressure, sore throat and trouble swallowing.   Eyes: Positive for pain, discharge and redness. Negative for photophobia and visual disturbance.  Respiratory: Negative for cough, chest tightness and shortness of breath.   Cardiovascular: Negative for chest pain.  Gastrointestinal: Negative for abdominal pain, diarrhea, nausea and vomiting.  Musculoskeletal: Negative for myalgias.  Skin: Negative for rash.  Neurological: Negative for dizziness, light-headedness and headaches.     Physical Exam Triage Vital  Signs ED Triage Vitals  Enc Vitals Group     BP 05/08/19 1158 (!) 128/95     Pulse Rate 05/08/19 1158 (!) 101     Resp 05/08/19 1158 18     Temp 05/08/19 1158 98.3 F (36.8 C)     Temp Source 05/08/19 1158 Oral     SpO2 05/08/19 1158 100 %     Weight 05/08/19 1159 180 lb 3.2 oz (81.7 kg)     Height --      Head Circumference --      Peak Flow --       Pain Score 05/08/19 1159 4     Pain Loc --      Pain Edu? --      Excl. in GC? --    No data found.  Updated Vital Signs BP (!) 128/95 (BP Location: Right Arm)   Pulse (!) 101   Temp 98.3 F (36.8 C) (Oral)   Resp 18   Wt 180 lb 3.2 oz (81.7 kg)   SpO2 100%   BMI 34.05 kg/m   Visual Acuity Right Eye Distance:  20/70 Left Eye Distance:  20/70 Bilateral Distance:  20/70  Right Eye Near:   Left Eye Near:    Bilateral Near:     Physical Exam Vitals signs and nursing note reviewed.  Constitutional:      Appearance: She is well-developed.     Comments: No acute distress  HENT:     Head: Normocephalic and atraumatic.     Ears:     Comments: Bilateral cerumen impaction    Nose: Nose normal.  Eyes:     Extraocular Movements: Extraocular movements intact.     Conjunctiva/sclera: Conjunctivae normal.     Pupils: Pupils are equal, round, and reactive to light.     Comments: Bilateral periorbital area is erythematous, minimally tender to touch, no obvious swelling, conjunctive a erythematous bilaterally, more prominent on right, patient does not tolerate touching near eyes well, squeezes shut with any manipulation of the eye.  When examining with light, it does appear that she may have some abrasions to her cornea despite being unable to perform fluorescein stain  Neck:     Musculoskeletal: Neck supple.  Cardiovascular:     Rate and Rhythm: Normal rate.  Pulmonary:     Effort: Pulmonary effort is normal. No respiratory distress.  Abdominal:     General: There is no distension.  Musculoskeletal: Normal range of motion.  Skin:    General: Skin is warm and dry.  Neurological:     Mental Status: She is alert and oriented to person, place, and time.      UC Treatments / Results  Labs (all labs ordered are listed, but only abnormal results are displayed) Labs Reviewed - No data to display  EKG   Radiology No results found.  Procedures Procedures (including  critical care time)  Medications Ordered in UC Medications - No data to display  Initial Impression / Assessment and Plan / UC Course  I have reviewed the triage vital signs and the nursing notes.  Pertinent labs & imaging results that were available during my care of the patient were reviewed by me and considered in my medical decision making (see chart for details).     Given erythema surrounding orbital areas initiating on Augmentin although has minimal pain to touch and is less suggestive of septal/preseptal cellulitis.  Also providing erythromycin ointment to treat for conjunctivitis as well as treat  any underlying corneal abrasion.  Do recommend patient to follow-up with orthopedics as she may have inadvertently scratched her cornea.  Continue to monitor, follow-up here or emergency room if developing worsening pain redness swelling or fevers, recommended ophthalmology follow-up next week.  Discussed strict return precautions. Patient verbalized understanding and is agreeable with plan.  Final Clinical Impressions(s) / UC Diagnoses   Final diagnoses:  Acute bacterial conjunctivitis of both eyes     Discharge Instructions     Please begin augmentin twice daily for 1 week Erythromycin ointment 4 times daily into bilateral lower lids Please monitor for gradual improvement of symptoms Please follow-up with ophthalmology next week-contact info below If pain redness swelling worsening please follow-up here or emergency room   ED Prescriptions    Medication Sig Dispense Auth. Provider   amoxicillin-clavulanate (AUGMENTIN) 875-125 MG tablet Take 1 tablet by mouth every 12 (twelve) hours for 7 days. 14 tablet Mikal Blasdell C, PA-C   erythromycin ophthalmic ointment Place a 1/2 inch ribbon of ointment into the lower eyelid 4 times daily x 1 week 3.5 g Nahdia Doucet, [place] C, PA-C     PDMP not reviewed this encounter.   Lew DawesWieters, Meriel Kelliher C, [place] JerseyPA-C 05/08/19 [id number]

## 2019-05-08 NOTE — Discharge Instructions (Signed)
Please begin augmentin twice daily for 1 week Erythromycin ointment 4 times daily into bilateral lower lids Please monitor for gradual improvement of symptoms Please follow-up with ophthalmology next week-contact info below If pain redness swelling worsening please follow-up here or emergency room

## 2019-05-08 NOTE — ED Triage Notes (Signed)
Pt states her eyes are hurting and draining. This started yesterday.Pt has been using warm compress on her eyes.

## 2019-06-24 ENCOUNTER — Ambulatory Visit: Payer: Medicare Other | Admitting: Podiatry

## 2019-08-15 ENCOUNTER — Other Ambulatory Visit: Payer: Self-pay | Admitting: Family Medicine

## 2019-08-15 DIAGNOSIS — Z1231 Encounter for screening mammogram for malignant neoplasm of breast: Secondary | ICD-10-CM

## 2019-08-20 ENCOUNTER — Other Ambulatory Visit: Payer: Self-pay

## 2019-08-20 ENCOUNTER — Encounter: Payer: Self-pay | Admitting: Podiatry

## 2019-08-20 ENCOUNTER — Ambulatory Visit (INDEPENDENT_AMBULATORY_CARE_PROVIDER_SITE_OTHER): Payer: Medicare Other | Admitting: Podiatry

## 2019-08-20 VITALS — Temp 97.1°F

## 2019-08-20 DIAGNOSIS — M79674 Pain in right toe(s): Secondary | ICD-10-CM

## 2019-08-20 DIAGNOSIS — M79675 Pain in left toe(s): Secondary | ICD-10-CM

## 2019-08-20 DIAGNOSIS — B351 Tinea unguium: Secondary | ICD-10-CM

## 2019-08-20 NOTE — Patient Instructions (Signed)
Fungal Nail Infection A fungal nail infection is a common infection of the toenails or fingernails. This condition affects toenails more often than fingernails. It often affects the great, or big, toes. More than one nail may be infected. The condition can be passed from person to person (is contagious). What are the causes? This condition is caused by a fungus. Several types of fungi can cause the infection. These fungi are common in moist and warm areas. If your hands or feet come into contact with the fungus, it may get into a crack in your fingernail or toenail and cause the infection. What increases the risk? The following factors may make you more likely to develop this condition:  Being female.  Being of older age.  Living with someone who has the fungus.  Walking barefoot in areas where the fungus thrives, such as showers or locker rooms.  Wearing shoes and socks that cause your feet to sweat.  Having a nail injury or a recent nail surgery.  Having certain medical conditions, such as: ? Athlete's foot. ? Diabetes. ? Psoriasis. ? Poor circulation. ? A weak body defense system (immune system). What are the signs or symptoms? Symptoms of this condition include:  A pale spot on the nail.  Thickening of the nail.  A nail that becomes yellow or [place].  A brittle or ragged nail edge.  A crumbling nail.  A nail that has lifted away from the nail bed. How is this diagnosed? This condition is diagnosed with a physical exam. Your health care provider may take a scraping or clipping from your nail to test for the fungus. How is this treated? Treatment is not needed for mild infections. If you have significant nail changes, treatment may include:  Antifungal medicines taken by mouth (orally). You may need to take the medicine for several weeks or several months, and you may not see the results for a long time. These medicines can cause side effects. Ask your health care provider  what problems to watch for.  Antifungal nail polish or nail cream. These may be used along with oral antifungal medicines.  Laser treatment of the nail.  Surgery to remove the nail. This may be needed for the most severe infections. It can take a long time, usually up to a year, for the infection to go away. The infection may also come back. Follow these instructions at home: Medicines  Take or apply over-the-counter and prescription medicines only as told by your health care provider.  Ask your health care provider about using over-the-counter mentholated ointment on your nails. Nail care  Trim your nails often.  Wash and dry your hands and feet every day.  Keep your feet dry: ? Wear absorbent socks, and change your socks frequently. ? Wear shoes that allow air to circulate, such as sandals or canvas tennis shoes. Throw out old shoes.  Do not use artificial nails.  If you go to a nail salon, make sure you choose one that uses clean instruments.  Use antifungal foot powder on your feet and in your shoes. General instructions  Do not share personal items, such as towels or nail clippers.  Do not walk barefoot in shower rooms or locker rooms.  Wear rubber gloves if you are working with your hands in wet areas.  Keep all follow-up visits as told by your health care provider. This is important. Contact a health care provider if: Your infection is not getting better or it is getting worse   after several months. Summary  A fungal nail infection is a common infection of the toenails or fingernails.  Treatment is not needed for mild infections. If you have significant nail changes, treatment may include taking medicine orally and applying medicine to your nails.  It can take a long time, usually up to a year, for the infection to go away. The infection may also come back.  Take or apply over-the-counter and prescription medicines only as told by your health care  provider.  Follow instructions for taking care of your nails to help prevent infection from coming back or spreading. This information is not intended to replace advice given to you by your health care provider. Make sure you discuss any questions you have with your health care provider. Document Revised: 09/26/2018 Document Reviewed: 11/09/2017 Elsevier Patient Education  2020 [employer] Inc[employer]  

## 2019-08-25 NOTE — Progress Notes (Signed)
Subjective: Andrea Frank presents today for follow up of painful mycotic nails b/l that are difficult to trim. Pain interferes with ambulation. Aggravating factors include wearing enclosed shoe gear. Pain is relieved with periodic professional debridement.   No Known Allergies   Objective: Vitals:   08/20/19 1359  Temp: (!) 97.1 F (36.2 C)    Pt 39 y.o. year old female  in NAD. AAO x 3.   Vascular Examination:  Capillary refill time to digits immediate b/l. Palpable DP pulses b/l. Palpable PT pulses b/l. Pedal hair present b/l. Skin temperature gradient within normal limits b/l.  Dermatological Examination: Pedal skin with normal turgor, texture and tone bilaterally. No open wounds bilaterally. No interdigital macerations bilaterally. Toenails 1-5 b/l elongated, dystrophic, thickened, crumbly with subungual debris and tenderness to dorsal palpation.  Musculoskeletal: Normal muscle strength 5/5 to all lower extremity muscle groups bilaterally, no gross bony deformities bilaterally and no pain crepitus or joint limitation noted with ROM b/l  Neurological: Protective sensation intact 5/5 intact bilaterally with 10g monofilament b/l Vibratory sensation intact b/l  Assessment: 1. Pain due to onychomycosis of toenails of both feet    Plan: -Toenails 1-5 b/l were debrided in length and girth with sterile nail nippers and dremel without iatrogenic bleeding.  -Patient to continue soft, supportive shoe gear daily. -Patient to report any pedal injuries to medical professional immediately. -Patient/POA to call should there be question/concern in the interim.  Return in about 3 months (around 11/20/2019).

## 2019-09-18 ENCOUNTER — Ambulatory Visit: Payer: Medicare Other

## 2019-10-10 ENCOUNTER — Ambulatory Visit: Payer: Medicare Other

## 2019-10-10 IMAGING — MG DIGITAL SCREENING BILATERAL MAMMOGRAM WITH TOMO AND CAD
8 series · 8 of 24 positions shown · non-contrast
Comparison: None.

CLINICAL DATA: Screening.

EXAM:
DIGITAL SCREENING BILATERAL MAMMOGRAM WITH TOMO AND CAD

[R CC synth-2D]
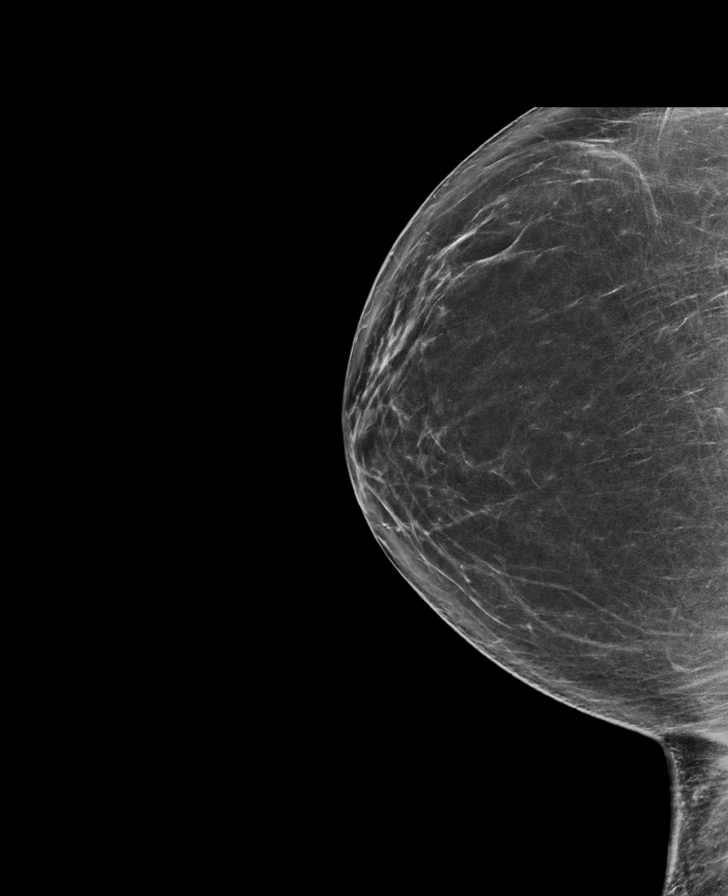

[L MLO synth-2D]
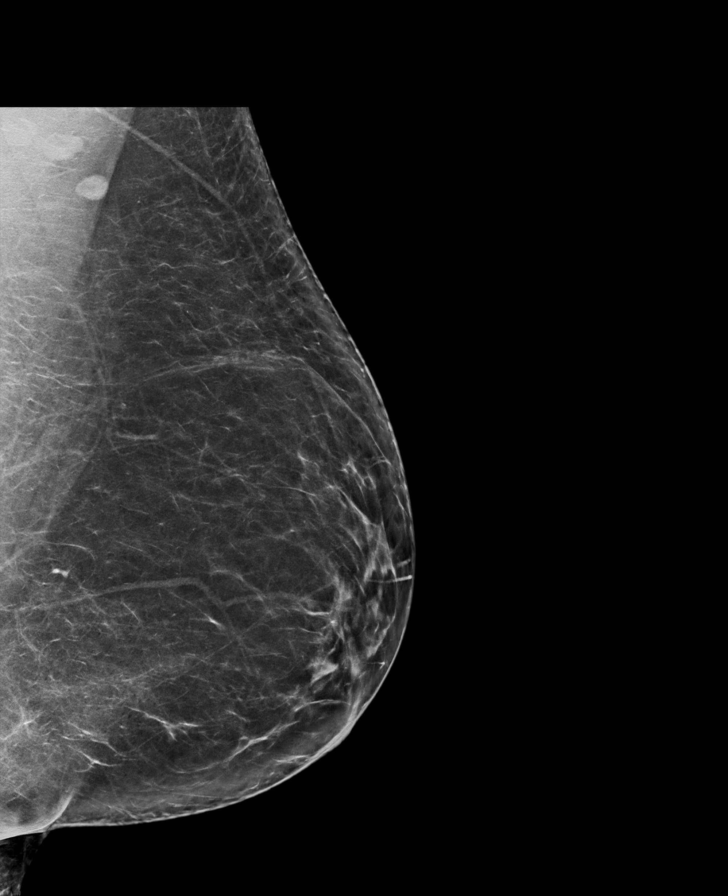

[L CC synth-2D]
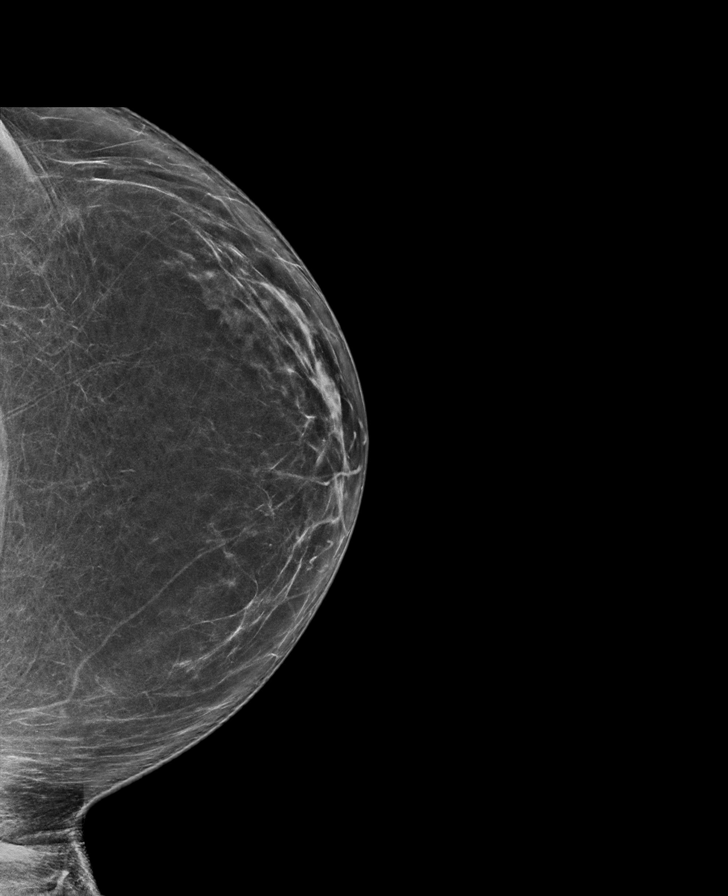

[R MLO synth-2D]
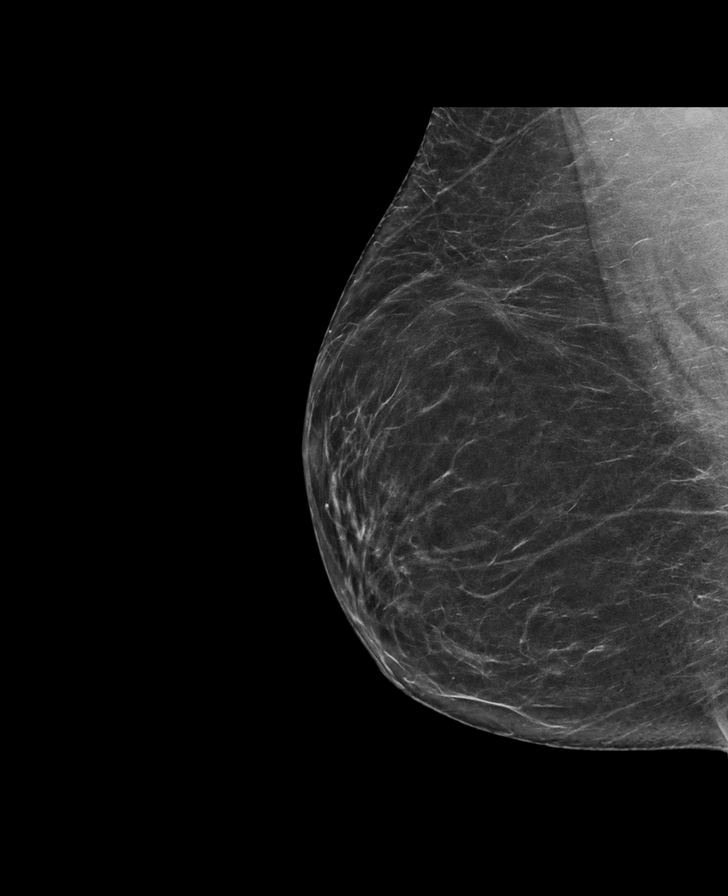

[L MLO tomo · tomo slice 43/85.0]
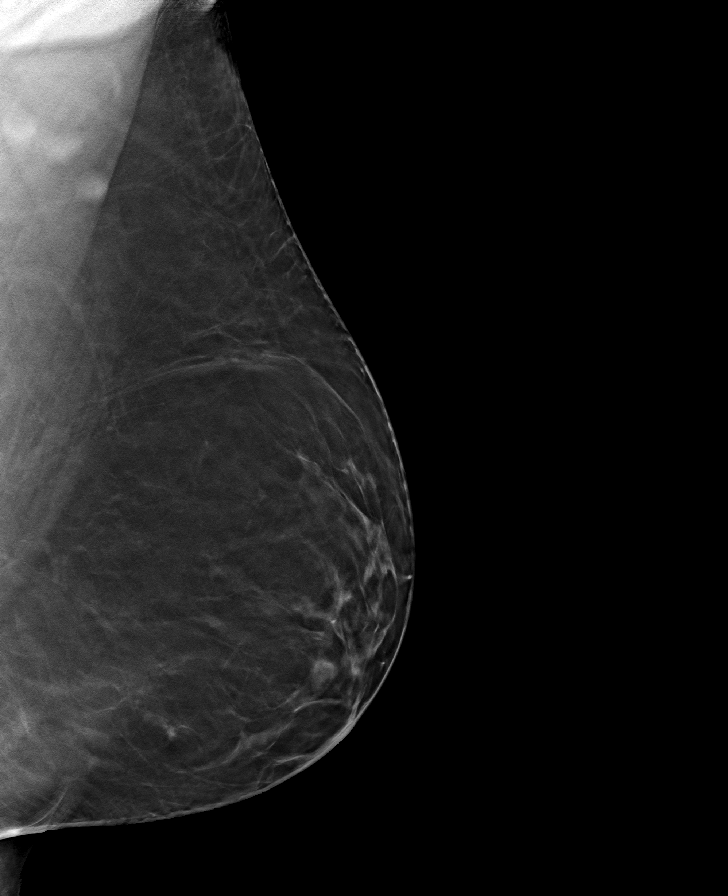

[R MLO tomo · tomo slice 39/77.0]
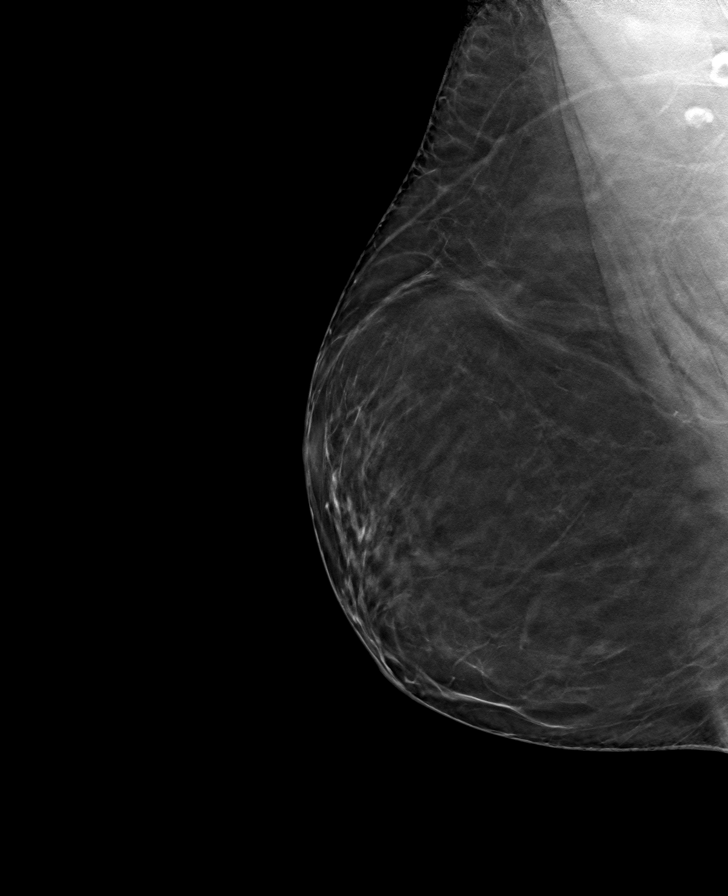

[R CC tomo · tomo slice 39/77.0]
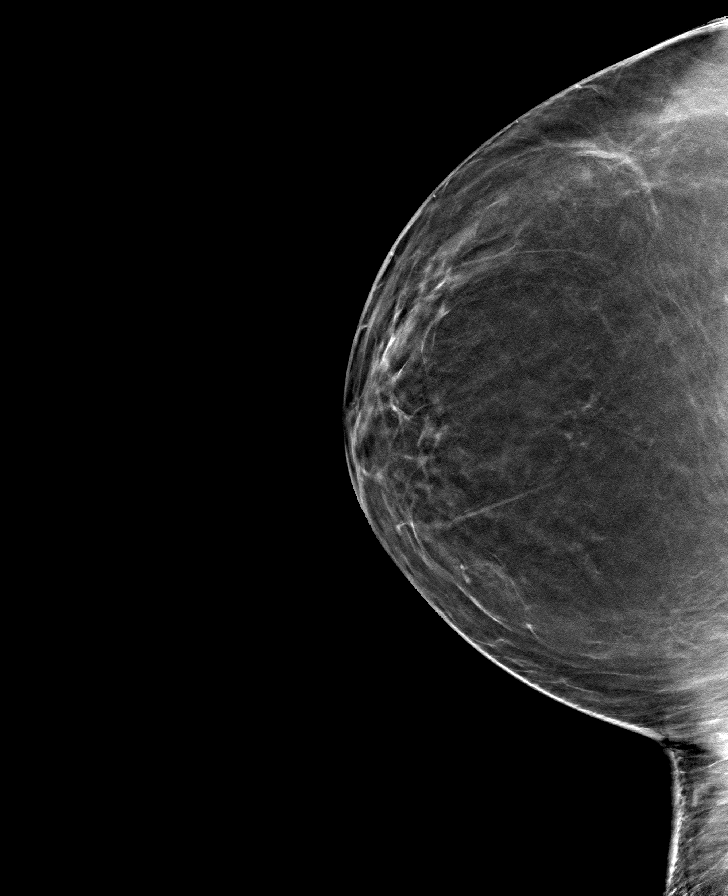

[L CC tomo · tomo slice 39/76.0]
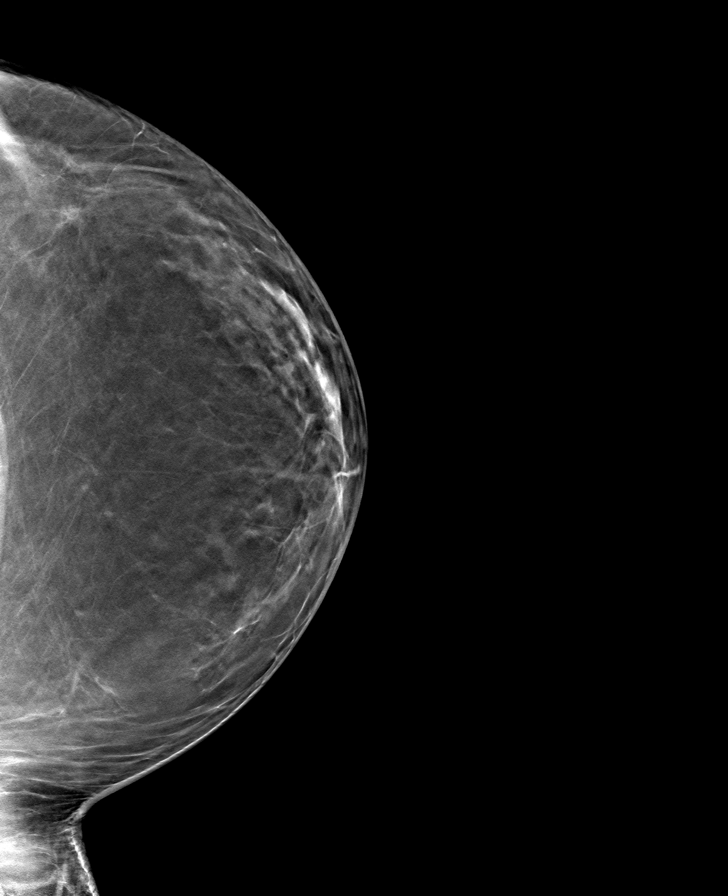

[8 of 24 positions shown; findings below may reference images not displayed]

ACR Breast Density Category b: There are scattered areas of
fibroglandular density.
FINDINGS: There are no findings suspicious for malignancy. Images were
processed with CAD.
IMPRESSION: No mammographic evidence of malignancy. A result letter of this
screening mammogram will be mailed directly to the patient.

RECOMMENDATION:
Screening mammogram at age 42. (Code:TW-R-FEN)

BI-RADS CATEGORY  1: Negative.

## 2019-11-21 ENCOUNTER — Ambulatory Visit (INDEPENDENT_AMBULATORY_CARE_PROVIDER_SITE_OTHER): Payer: Medicare Other | Admitting: Podiatry

## 2019-11-21 ENCOUNTER — Encounter: Payer: Self-pay | Admitting: Podiatry

## 2019-11-21 ENCOUNTER — Other Ambulatory Visit: Payer: Self-pay

## 2019-11-21 DIAGNOSIS — M79675 Pain in left toe(s): Secondary | ICD-10-CM | POA: Diagnosis not present

## 2019-11-21 DIAGNOSIS — B351 Tinea unguium: Secondary | ICD-10-CM | POA: Diagnosis not present

## 2019-11-21 DIAGNOSIS — B353 Tinea pedis: Secondary | ICD-10-CM | POA: Diagnosis not present

## 2019-11-21 DIAGNOSIS — M79674 Pain in right toe(s): Secondary | ICD-10-CM

## 2019-11-21 MED ORDER — CLOTRIMAZOLE-BETAMETHASONE 1-0.05 % EX CREA: TOPICAL_CREAM | CUTANEOUS | 0 refills | Status: DC

## 2019-11-21 NOTE — Patient Instructions (Signed)

## 2019-11-26 NOTE — Progress Notes (Addendum)
Subjective: Andrea Frank is a 39 y.o. female patient seen today painful mycotic nails b/l that are difficult to trim. Pain interferes with ambulation. Aggravating factors include wearing enclosed shoe gear. Pain is relieved with periodic professional debridement.  Patient Active Problem List   Diagnosis Date Noted  . Pain in joint, ankle and foot 10/23/2012  . Onychomycosis due to dermatophyte 10/23/2012    Current Outpatient Medications on File Prior to Visit  Medication Sig Dispense Refill  . calcium-vitamin D (OSCAL WITH D) 500-200 MG-UNIT TABS tablet Take 1 tablet by mouth 2 (two) times daily.    . clindamycin-benzoyl peroxide (BENZACLIN) gel     . Ergocalciferol (VITAMIN D2 PO) Take by mouth. 2 day weekly    . erythromycin ophthalmic ointment Place a 1/2 inch ribbon of ointment into the lower eyelid 4 times daily x 1 week 3.5 g 0  . hydrocortisone 2.5 % cream     . medroxyPROGESTERone (DEPO-PROVERA) 150 MG/ML injection Inject 150 mg into the muscle every 3 (three) months.     . medroxyPROGESTERone Acetate 150 MG/ML SUSY Inject 1 mL into the muscle every 3 (three) months.    . metroNIDAZOLE (FLAGYL) 500 MG tablet Take 500 mg by mouth 2 (two) times daily.    . RESTASIS 0.05 % ophthalmic emulsion     . Selenium Sulfide (SELSUN BLUE MEDICATED EX) Apply topically.     No current facility-administered medications on file prior to visit.    No Known Allergies  Objective: Physical Exam  General: Patient is a pleasant 39 y.o. Caucasian female in no acute distress, Awake, alert and oriented x 3  Neurovascular status unchanged b/l.  Capillary refill time to digits immediate b/l. Palpable DP pulses b/l. Palpable PT pulses b/l. Pedal hair present b/l. Skin temperature gradient within normal limits b/l.   Protective sensation intact 5/5 intact bilaterally with 10g monofilament b/l. Vibratory sensation intact b/l.  Dermatological:  Pedal skin with normal turgor, texture and tone  bilaterally. No open wounds bilaterally. No interdigital macerations bilaterally. Toenails 1-5 b/l elongated, discolored, dystrophic, thickened, crumbly with subungual debris and tenderness to dorsal palpation. Diffuse scaling noted peripherally and plantarly b/l feet with mild foot odor.  No interdigital macerations.  No blisters, no weeping. No signs of secondary bacterial infection noted.  Musculoskeletal:  Normal muscle strength 5/5 to all lower extremity muscle groups bilaterally. No pain crepitus or joint limitation noted with ROM b/l. No gross bony deformities bilaterally. Patient ambulates independent of any assistive aids.  Assessment and Plan:  1. Pain due to onychomycosis of toenails of both feet   2. Tinea pedis of both feet    -Examined patient. -Toenails 1-5 b/l were debrided in length and girth with sterile nail nippers and dremel without iatrogenic bleeding.  -Patient to continue soft, supportive shoe gear daily. -Patient to report any pedal injuries to medical professional immediately. -For tinea pedis, prescription written for Lotrisone Cream 1%/0.05% to be applied to both feet twice daily for 4 weeks. Spray shoes with disinfectant spray. Clean shower/bathtub with bleach based cleanser. -Patient/POA to call should there be question/concern in the interim.  Return in about 3 months (around 02/21/2020) for nail trim.  Freddie Breech, DPM

## 2020-03-01 ENCOUNTER — Ambulatory Visit (INDEPENDENT_AMBULATORY_CARE_PROVIDER_SITE_OTHER): Payer: Medicare Other | Admitting: Podiatry

## 2020-03-01 ENCOUNTER — Other Ambulatory Visit: Payer: Self-pay

## 2020-03-01 ENCOUNTER — Encounter: Payer: Self-pay | Admitting: Podiatry

## 2020-03-01 DIAGNOSIS — M79675 Pain in left toe(s): Secondary | ICD-10-CM | POA: Diagnosis not present

## 2020-03-01 DIAGNOSIS — B351 Tinea unguium: Secondary | ICD-10-CM

## 2020-03-01 DIAGNOSIS — M79674 Pain in right toe(s): Secondary | ICD-10-CM

## 2020-03-02 NOTE — Progress Notes (Signed)
Subjective: Andrea Frank is a 39 y.o. female patient seen today painful mycotic nails b/l that are difficult to trim. Pain interferes with ambulation. Aggravating factors include wearing enclosed shoe gear. Pain is relieved with periodic professional debridement.  She voices no new pedal complaints on today's visit. States she uses downstairs in her group home.   Patient Active Problem List   Diagnosis Date Noted  . Pain in joint, ankle and foot 10/23/2012  . Onychomycosis due to dermatophyte 10/23/2012    Current Outpatient Medications on File Prior to Visit  Medication Sig Dispense Refill  . calcium-vitamin D (OSCAL WITH D) 500-200 MG-UNIT TABS tablet Take 1 tablet by mouth 2 (two) times daily.    . clindamycin-benzoyl peroxide (BENZACLIN) gel     . clotrimazole-betamethasone (LOTRISONE) cream APPLY TO BOTH FEET AND BETWEEN TOES TWICE DAILY FOR 6 WEEKS. 45 g 0  . Ergocalciferol (VITAMIN D2 PO) Take by mouth. 2 day weekly    . erythromycin ophthalmic ointment Place a 1/2 inch ribbon of ointment into the lower eyelid 4 times daily x 1 week 3.5 g 0  . hydrocortisone 2.5 % cream     . medroxyPROGESTERone (DEPO-PROVERA) 150 MG/ML injection Inject 150 mg into the muscle every 3 (three) months.     . medroxyPROGESTERone Acetate 150 MG/ML SUSY Inject 1 mL into the muscle every 3 (three) months.    . metroNIDAZOLE (FLAGYL) 500 MG tablet Take 500 mg by mouth 2 (two) times daily.    . RESTASIS 0.05 % ophthalmic emulsion     . Selenium Sulfide (SELSUN BLUE MEDICATED EX) Apply topically.     No current facility-administered medications on file prior to visit.    No Known Allergies  Objective: Physical Exam  General: Patient is a pleasant 39 y.o. Caucasian female in no acute distress, Awake, alert and oriented x 3  Neurovascular status unchanged b/l.  Capillary refill time to digits immediate b/l. Palpable DP pulses b/l. Palpable PT pulses b/l. Pedal hair present b/l. Skin temperature  gradient within normal limits b/l.   Protective sensation intact 5/5 intact bilaterally with 10g monofilament b/l. Vibratory sensation intact b/l.  Dermatological:  Pedal skin with normal turgor, texture and tone bilaterally. No open wounds bilaterally. No interdigital macerations bilaterally. Toenails 1-5 b/l elongated, discolored, dystrophic, thickened, crumbly with subungual debris and tenderness to dorsal palpation. Noted improvement noticed in appearance of both feet with residual scaling.  No interdigital macerations.  No blisters, no weeping. No signs of secondary bacterial infection noted.  Musculoskeletal:  Normal muscle strength 5/5 to all lower extremity muscle groups bilaterally. No pain crepitus or joint limitation noted with ROM b/l. No gross bony deformities bilaterally. Patient ambulates independent of any assistive aids.  Assessment and Plan:  1. Pain due to onychomycosis of toenails of both feet    -Examined patient. -Toenails 1-5 b/l were debrided in length and girth with sterile nail nippers and dremel without iatrogenic bleeding.  -Patient to continue soft, supportive shoe gear daily. -Patient to report any pedal injuries to medical professional immediately. -Continue Lotrisone Cream 1%/0.05% to be applied to both feet twice daily for 4 weeks.  -Continue spraying both shoes with disinfectant spray.  -Continue to clean shower/bathtub with bleach based cleanser. -Patient/POA to call should there be question/concern in the interim.  Return in about 3 months (around 05/31/2020).  Freddie Breech, DPM

## 2020-06-01 ENCOUNTER — Ambulatory Visit: Payer: Medicare Other | Admitting: Podiatry

## 2020-08-18 ENCOUNTER — Encounter: Payer: Self-pay | Admitting: Podiatry

## 2020-08-18 ENCOUNTER — Ambulatory Visit (INDEPENDENT_AMBULATORY_CARE_PROVIDER_SITE_OTHER): Payer: Medicare Other | Admitting: Podiatry

## 2020-08-18 ENCOUNTER — Ambulatory Visit: Payer: Medicare Other | Admitting: Podiatry

## 2020-08-18 ENCOUNTER — Other Ambulatory Visit: Payer: Self-pay

## 2020-08-18 DIAGNOSIS — M79674 Pain in right toe(s): Secondary | ICD-10-CM

## 2020-08-18 DIAGNOSIS — M79675 Pain in left toe(s): Secondary | ICD-10-CM | POA: Diagnosis not present

## 2020-08-18 DIAGNOSIS — B351 Tinea unguium: Secondary | ICD-10-CM

## 2020-08-25 NOTE — Progress Notes (Signed)
Subjective: Andrea Frank is a 40 y.o. female patient seen today painful mycotic nails b/l that are difficult to trim. Pain interferes with ambulation. Aggravating factors include wearing enclosed shoe gear. Pain is relieved with periodic professional debridement.  She voices no new pedal complaints on today's visit.    No Known Allergies  Objective: Physical Exam  General: Patient is a pleasant 40 y.o. Caucasian female in no acute distress, Awake, alert and oriented x 3  Neurovascular status unchanged b/l.  Capillary refill time to digits immediate b/l. Palpable DP pulses b/l. Palpable PT pulses b/l. Pedal hair present b/l. Skin temperature gradient within normal limits b/l.   Protective sensation intact 5/5 intact bilaterally with 10g monofilament b/l. Vibratory sensation intact b/l.  Dermatological:  Pedal skin with normal turgor, texture and tone bilaterally. No open wounds bilaterally. No interdigital macerations bilaterally. Toenails 1-5 b/l elongated, discolored, dystrophic, thickened, crumbly with subungual debris and tenderness to dorsal palpation. Hyperkeratotic lesion submet head 1 b/l and b/l hallux.  No edema, no erythema, no drainage, no fluctuance.  Musculoskeletal:  Normal muscle strength 5/5 to all lower extremity muscle groups bilaterally. No pain crepitus or joint limitation noted with ROM b/l. No gross bony deformities bilaterally. Patient ambulates independent of any assistive aids.  Assessment and Plan:  1. Pain due to onychomycosis of toenails of both feet    -Examined patient. -Toenails 1-5 b/l were debrided in length and girth with sterile nail nippers and dremel without iatrogenic bleeding.  -Medicare and Medicaid ABNs sent to facility for POA to sign should they want her to have calluses pared. -Patient to continue soft, supportive shoe gear daily. -Patient to report any pedal injuries to medical professional immediately.  -Continue spraying both shoes with  disinfectant spray.  -Continue to clean shower/bathtub with bleach based cleanser. -Patient/POA to call should there be question/concern in the interim.  Return in about 3 months (around 11/18/2020).  Freddie Breech, DPM

## 2020-11-22 ENCOUNTER — Ambulatory Visit: Payer: Medicare Other | Admitting: Podiatry

## 2020-11-23 ENCOUNTER — Ambulatory Visit: Payer: Medicare Other | Admitting: Podiatry

## 2020-12-01 ENCOUNTER — Ambulatory Visit (INDEPENDENT_AMBULATORY_CARE_PROVIDER_SITE_OTHER): Payer: Medicare Other | Admitting: Podiatry

## 2020-12-01 ENCOUNTER — Encounter: Payer: Self-pay | Admitting: Podiatry

## 2020-12-01 ENCOUNTER — Other Ambulatory Visit: Payer: Self-pay

## 2020-12-01 DIAGNOSIS — B351 Tinea unguium: Secondary | ICD-10-CM | POA: Diagnosis not present

## 2020-12-01 DIAGNOSIS — M79675 Pain in left toe(s): Secondary | ICD-10-CM | POA: Diagnosis not present

## 2020-12-01 DIAGNOSIS — M79674 Pain in right toe(s): Secondary | ICD-10-CM | POA: Diagnosis not present

## 2020-12-07 NOTE — Progress Notes (Signed)
  Subjective:  Patient ID: Andrea Frank, female    DOB: 1980/09/28,  MRN: [id number]  Andrea Frank presents to clinic today for painful thick toenails that are difficult to trim. Pain interferes with ambulation. Aggravating factors include wearing enclosed shoe gear. Pain is relieved with periodic professional debridement.  She is accompanied by her Facility caregiver on today's visit.They voice no new pedal concerns on today's visit. Caregiver states Andrea Frank cannot afford price of callus paring.   No Known Allergies  Review of Systems: Negative except as noted in the HPI. Objective:   Constitutional Andrea Frank is a pleasant 40 y.o. Caucasian female, morbidly obese in NAD. AAO x 3.   Vascular Capillary refill time to digits immediate b/l. Palpable pedal pulses b/l LE. Pedal hair present. Lower extremity skin temperature gradient within normal limits. No pain with calf compression b/l. No cyanosis or clubbing noted.  Neurologic Normal speech. Oriented to person, place, and time. Protective sensation intact 5/5 intact bilaterally with 10g monofilament b/l. Vibratory sensation intact b/l.  Dermatologic Toenails 1-5 b/l elongated, discolored, dystrophic, thickened, crumbly with subungual debris and tenderness to dorsal palpation. Hyperkeratotic lesion(s) L hallux, R hallux, submet head 1 left foot, and submet head 1 right foot.  No erythema, no edema, no drainage, no fluctuance.  Orthopedic: Normal muscle strength 5/5 to all lower extremity muscle groups bilaterally. No pain crepitus or joint limitation noted with ROM b/l. No gross bony deformities bilaterally.   Radiographs: None Assessment:   1. Pain due to onychomycosis of toenails of both feet    Plan:  Patient was evaluated and treated and all questions answered.  Onychomycosis with pain -Nails palliatively debridement as below -Educated on self-care  Procedure: Nail Debridement Rationale: Pain Type of Debridement: manual,  sharp debridement. Instrumentation: Nail nipper, rotary burr. Number of Nails: 10 -Examined patient. -Patient to continue soft, supportive shoe gear daily. -We sent Medicare and Medicaid ABNs to facility on last visit. Have not received signed forms back.Therefore, calluses not pared on today. -Toenails 1-5 b/l were debrided in length and girth with sterile nail nippers and dremel without iatrogenic bleeding.  -Patient to report any pedal injuries to medical professional immediately. -Patient/POA to call should there be question/concern in the interim.  Return in about 3 months (around 03/03/2021).  Freddie Breech, DPM

## 2021-01-19 ENCOUNTER — Other Ambulatory Visit: Payer: Self-pay

## 2021-01-19 ENCOUNTER — Encounter (HOSPITAL_COMMUNITY): Payer: Self-pay

## 2021-01-19 ENCOUNTER — Ambulatory Visit (HOSPITAL_COMMUNITY)
Admission: EM | Admit: 2021-01-19 | Discharge: 2021-01-19 | Disposition: A | Discharge status: Home / Self Care | Payer: Medicare Other | Attending: Internal Medicine | Admitting: Internal Medicine

## 2021-01-19 DIAGNOSIS — R519 Headache, unspecified: Secondary | ICD-10-CM
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
## 2021-01-19 NOTE — Discharge Instructions (Addendum)
Gentle range of motion exercises Take medications as prescribed. Return to urgent care if symptoms worsen. 
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
## 2021-03-09 ENCOUNTER — Other Ambulatory Visit: Payer: Self-pay

## 2021-03-09 ENCOUNTER — Ambulatory Visit (INDEPENDENT_AMBULATORY_CARE_PROVIDER_SITE_OTHER): Payer: Medicare Other | Admitting: Podiatry

## 2021-03-09 ENCOUNTER — Encounter: Payer: Self-pay | Admitting: Podiatry

## 2021-03-09 DIAGNOSIS — M79675 Pain in left toe(s): Secondary | ICD-10-CM | POA: Diagnosis not present

## 2021-03-09 DIAGNOSIS — B351 Tinea unguium: Secondary | ICD-10-CM

## 2021-03-09 DIAGNOSIS — M79674 Pain in right toe(s): Secondary | ICD-10-CM

## 2021-03-13 NOTE — Progress Notes (Signed)
  Subjective:  Patient ID: Andrea Frank, female    DOB: 07/07/1980,  MRN: [id number]  40 y.o. female presents thick, elongated toenails b/l feet which are tender when wearing enclosed shoe gear.  She voices no new pedal problems on today's visit.  PCP is Leilani Able, MD.  No Known Allergies  Review of Systems: Negative except as noted in the HPI.   Objective:  Vascular Examination: Vascular status intact b/l with palpable pedal pulses. CFT immediate b/l. No edema. No pain with calf compression b/l. Skin temperature gradient WNL b/l. Pedal hair present b/l.  Neurological Examination: Sensation grossly intact b/l with 10 gram monofilament. Vibratory sensation intact b/l.   Dermatological Examination: Pedal skin with normal turgor, texture and tone b/l. Toenails 1-5 b/l elongated, discolored, dystrophic, thickened, crumbly with subungual debris and tenderness to dorsal palpation. Hyperkeratotic lesion(s) L hallux, R hallux, submet head 1 left foot, and submet head 1 right foot.  No erythema, no edema, no drainage, no fluctuance.  Musculoskeletal Examination: Muscle strength 5/5 to b/l LE.  Radiographs: None Assessment:   1. Pain due to onychomycosis of toenails of both feet    Plan:  -Examined patient. -No new findings. No new orders. -We have not received signed Medicare/Medicaid ABNs for paring of calluses. They were not pared on today's visit. -Patient to continue soft, supportive shoe gear daily. -Toenails 1-5 b/l were debrided in length and girth with sterile nail nippers and dremel without iatrogenic bleeding.  -Patient to report any pedal injuries to medical professional immediately. -Patient/POA to call should there be question/concern in the interim.  Return in about 3 months (around 06/08/2021).  Freddie Breech, DPM

## 2021-06-03 ENCOUNTER — Ambulatory Visit (INDEPENDENT_AMBULATORY_CARE_PROVIDER_SITE_OTHER): Payer: Medicare Other | Admitting: Podiatry

## 2021-06-03 ENCOUNTER — Other Ambulatory Visit: Payer: Self-pay

## 2021-06-03 DIAGNOSIS — B351 Tinea unguium: Secondary | ICD-10-CM

## 2021-06-03 DIAGNOSIS — M79674 Pain in right toe(s): Secondary | ICD-10-CM

## 2021-06-03 DIAGNOSIS — M79675 Pain in left toe(s): Secondary | ICD-10-CM

## 2021-06-08 ENCOUNTER — Encounter: Payer: Self-pay | Admitting: Podiatry

## 2021-06-08 NOTE — Progress Notes (Signed)
°  Subjective:  Patient ID: Andrea Frank, female    DOB: 26-Feb-1981,  MRN: [id number]  Andrea Frank presents to clinic today for painful elongated mycotic toenails 1-5 bilaterally which are tender when wearing enclosed shoe gear. Pain is relieved with periodic professional debridement.  Patient notes no new pedal problems on today's visit.  PCP is Leilani Able, MD.  No Known Allergies  Review of Systems: Negative except as noted in the HPI. Objective:   Constitutional Andrea Frank is a pleasant 40 y.o. Caucasian female, obese in NAD. AAO x 3.   Vascular CFT immediate b/l LE. Palpable DP/PT pulses b/l LE. Digital hair present b/l. Skin temperature gradient WNL b/l. No pain with calf compression b/l. No edema noted b/l. No cyanosis or clubbing noted b/l LE.  Neurologic Normal speech. Oriented to person, place, and time. Protective sensation intact 5/5 intact bilaterally with 10g monofilament b/l. Vibratory sensation intact b/l.  Dermatologic Pedal skin is warm and supple b/l LE. No open wounds b/l LE. No interdigital macerations noted b/l LE. Toenails 1-5 b/l elongated, discolored, dystrophic, thickened, crumbly with subungual debris and tenderness to dorsal palpation. Hyperkeratotic lesion(s) bilateral great toes.  No erythema, no edema, no drainage, no fluctuance.  Orthopedic: Muscle strength 5/5 to all lower extremity muscle groups bilaterally. No pain, crepitus or joint limitation noted with ROM bilateral LE. HAV with bunion deformity noted b/l LE. Patient ambulates independent of any assistive aids.   Radiographs: None   Assessment:   1. Pain due to onychomycosis of toenails of both feet    Plan:  Patient was evaluated and treated and all questions answered. Consent given for treatment as described below: -Examined patient. -We still have not received signed ABN for callus paring. She lives in a group home and I have sent ABNs for her POA to sign. Calluses were not pared. Written  communication added to patient's group home chart. -Mycotic toenails 1-5 bilaterally were debrided in length and girth with sterile nail nippers and dremel without incident. -Patient/POA to call should there be question/concern in the interim.  Return in about 3 months (around 09/01/2021).  Freddie Breech, DPM

## 2021-09-09 ENCOUNTER — Ambulatory Visit (INDEPENDENT_AMBULATORY_CARE_PROVIDER_SITE_OTHER): Payer: Medicare Other | Admitting: Podiatry

## 2021-09-09 ENCOUNTER — Other Ambulatory Visit: Payer: Self-pay

## 2021-09-09 ENCOUNTER — Encounter: Payer: Self-pay | Admitting: Podiatry

## 2021-09-09 DIAGNOSIS — M79675 Pain in left toe(s): Secondary | ICD-10-CM

## 2021-09-09 DIAGNOSIS — M79674 Pain in right toe(s): Secondary | ICD-10-CM | POA: Diagnosis not present

## 2021-09-09 DIAGNOSIS — B351 Tinea unguium: Secondary | ICD-10-CM

## 2021-09-15 NOTE — Progress Notes (Signed)
?  Subjective:  ?Patient ID: Andrea Frank, female    DOB: September 18, 1980,  MRN: [id number] ? ?Andrea Frank presents to clinic today for painful elongated mycotic toenails 1-5 bilaterally which are tender when wearing enclosed shoe gear. Pain is relieved with periodic professional debridement. ? ?Patient is a resident at [facility]. She voices no new pedal problems on today's visit. She is accompanied by Mr. Watlington on today's visit. ? ?New problem(s): None.  ? ?PCP is Leilani Able, MD. ? ?No Known Allergies ? ?Review of Systems: Negative except as noted in the HPI. ? ?Objective: No changes noted in today's physical examination. ?Constitutional Andrea Frank is a pleasant 41 y.o. Caucasian female, obese in NAD. AAO x 3.   ?Vascular CFT immediate b/l LE. Palpable DP/PT pulses b/l LE. Digital hair present b/l. Skin temperature gradient WNL b/l. No pain with calf compression b/l. No edema noted b/l. No cyanosis or clubbing noted b/l LE.  ?Neurologic Normal speech. Oriented to person, place, and time. Protective sensation intact 5/5 intact bilaterally with 10g monofilament b/l. Vibratory sensation intact b/l.  ?Dermatologic Pedal skin is warm and supple b/l LE. No open wounds b/l LE. No interdigital macerations noted b/l LE. Toenails 1-5 b/l elongated, discolored, dystrophic, thickened, crumbly with subungual debris and tenderness to dorsal palpation. Hyperkeratotic lesion(s) bilateral great toes.  No erythema, no edema, no drainage, no fluctuance.  ?Orthopedic: Muscle strength 5/5 to all lower extremity muscle groups bilaterally. No pain, crepitus or joint limitation noted with ROM bilateral LE. HAV with bunion deformity noted b/l LE. Patient ambulates independent of any assistive aids.  ? ?Radiographs: None ? ?Assessment/Plan: ?1. Pain due to onychomycosis of toenails of both feet   ?-No new findings. No new orders. ?-No ABN on file for callus debridement. Calluses not pared today.. ?-Toenails 1-5 b/l were  debrided in length and girth with sterile nail nippers and dremel without iatrogenic bleeding.  ?-Patient/POA to call should there be question/concern in the interim.  ? ?Return in about 3 months (around 12/10/2021). ? ?Freddie Breech, DPM  ?

## 2021-12-13 ENCOUNTER — Ambulatory Visit (INDEPENDENT_AMBULATORY_CARE_PROVIDER_SITE_OTHER): Payer: Medicare Other | Admitting: Podiatry

## 2021-12-13 ENCOUNTER — Encounter: Payer: Self-pay | Admitting: Podiatry

## 2021-12-13 DIAGNOSIS — M79675 Pain in left toe(s): Secondary | ICD-10-CM | POA: Diagnosis not present

## 2021-12-13 DIAGNOSIS — M79674 Pain in right toe(s): Secondary | ICD-10-CM | POA: Diagnosis not present

## 2021-12-13 DIAGNOSIS — B351 Tinea unguium: Secondary | ICD-10-CM

## 2021-12-13 DIAGNOSIS — B353 Tinea pedis: Secondary | ICD-10-CM

## 2021-12-13 MED ORDER — LOTRIMIN AF 2 % EX AERO: INHALATION_SPRAY | CUTANEOUS | 3 refills | Status: AC

## 2021-12-20 NOTE — Progress Notes (Signed)
  Subjective:  Patient ID: Andrea Frank, female    DOB: 03-18-1981,  MRN: [id number]  Andrea Frank presents to clinic today for painful thick toenails that are difficult to trim. Pain interferes with ambulation. Aggravating factors include wearing enclosed shoe gear. Pain is relieved with periodic professional debridement.  New problem(s): None.   PCP is Leilani Able, MD.  No Known Allergies  Review of Systems: Negative except as noted in the HPI.  Objective: No changes noted in today's physical examination. Constitutional Andrea Frank is a pleasant 41 y.o. Caucasian female, obese in NAD. AAO x 3.   Vascular CFT immediate b/l LE. Palpable DP/PT pulses b/l LE. Digital hair present b/l. Skin temperature gradient WNL b/l. No pain with calf compression b/l. No edema noted b/l. No cyanosis or clubbing noted b/l LE.  Neurologic Normal speech. Oriented to person, place, and time. Protective sensation intact 5/5 intact bilaterally with 10g monofilament b/l. Vibratory sensation intact b/l.  Dermatologic Pedal skin is warm and supple b/l LE. No open wounds b/l LE.Interdigital maceration noted bilateral 4th and bilateral 5th webspace(s). No blistering, no weeping, no open wounds.  Toenails 1-5 b/l elongated, discolored, dystrophic, thickened, crumbly with subungual debris and tenderness to dorsal palpation. Hyperkeratotic lesion(s) bilateral great toes.  No erythema, no edema, no drainage, no fluctuance.  Orthopedic: Muscle strength 5/5 to all lower extremity muscle groups bilaterally. No pain, crepitus or joint limitation noted with ROM bilateral LE. HAV with bunion deformity noted b/l LE. Patient ambulates independent of any assistive aids.   Radiographs: None  Assessment/Plan: 1. Pain due to onychomycosis of toenails of both feet   2. Tinea pedis of both feet     -Patient was evaluated and treated. All patient's and/or POA's questions/concerns answered on today's visit. -Patient to continue soft,  supportive shoe gear daily. -Toenails 1-5 b/l were debrided in length and girth with sterile nail nippers and dremel without iatrogenic bleeding.  -Rx written for Miconazole Spray Powder 2% antifungal spray powder. Spray between toes once daily. -Patient/POA to call should there be question/concern in the interim.   Return in about 3 months (around 03/15/2022).  Freddie Breech, DPM

## 2021-12-27 ENCOUNTER — Ambulatory Visit (HOSPITAL_COMMUNITY)
Admission: EM | Admit: 2021-12-27 | Discharge: 2021-12-27 | Disposition: A | Discharge status: Home / Self Care | Payer: Medicare Other

## 2021-12-27 ENCOUNTER — Other Ambulatory Visit: Payer: Self-pay

## 2021-12-27 ENCOUNTER — Encounter (HOSPITAL_COMMUNITY): Payer: Self-pay | Admitting: Emergency Medicine

## 2021-12-27 DIAGNOSIS — M799 Soft tissue disorder, unspecified: Secondary | ICD-10-CM

## 2021-12-27 NOTE — ED Triage Notes (Signed)
Swelling to inside of right knee/thigh .  Staff mentioned area to caregiver.

## 2021-12-27 NOTE — Discharge Instructions (Signed)
It is not an emergency. Andrea Frank's primary care provider and keep an eye on it.

## 2021-12-27 NOTE — ED Provider Notes (Signed)
[facility]    CSN: [id number] Arrival date & time: 12/27/21  1152      History   Chief Complaint Chief Complaint  Patient presents with   Leg Swelling    HPI Andrea Frank is a 41 y.o. female. Pt has Down syndrome and lives in a group home. Staff let caregiver know pt had a lump or swollen tissue on her inner R knee about 4 days ago. Pt reports she has no pain in the area no difficulty walking or pain walking, and did not notice it.  HPI  History reviewed. No pertinent past medical history.  Patient Active Problem List   Diagnosis Date Noted   Pain in joint, ankle and foot 10/23/2012   Onychomycosis due to dermatophyte 10/23/2012    History reviewed. No pertinent surgical history.  OB History   No obstetric history on file.      Home Medications    Prior to Admission medications   Medication Sig Start Date End Date Taking? Authorizing Provider  Calcium Carb-Cholecalciferol (OYSTER SHELL CALCIUM W/D) 500-200 MG-UNIT TABS Take 1 tablet by mouth 2 (two) times daily. 02/28/21   [provider]  ciprofloxacin (CIPRO) 250 MG tablet Take by mouth daily as needed. Patient not taking: Reported on 12/27/2021 05/24/21   [provider]  clindamycin (CLEOCIN) 150 MG capsule Take by mouth daily as needed. Patient not taking: Reported on 12/27/2021 04/21/21   [provider]  Ergocalciferol (VITAMIN D2 PO) Take by mouth. 2 day weekly    [provider]  medroxyPROGESTERone (DEPO-PROVERA) 150 MG/ML injection Inject 150 mg into the muscle every 3 (three) months.  07/16/12   [provider]  medroxyPROGESTERone Acetate 150 MG/ML SUSY Inject 1 mL into the muscle every 3 (three) months. 11/06/19   [provider]  Miconazole Nitrate (LOTRIMIN AF) 2 % AERO Spray between toes once daily. 12/13/21   Freddie Breech, DPM  NEUTROGENA T/GEL 0.5 % shampoo Apply topically. 08/30/21   [provider]  REFRESH RELIEVA 0.5-0.9  % ophthalmic solution SMARTSIG:In Eye(s) 12/05/21   [provider]  RESTASIS 0.05 % ophthalmic emulsion  10/17/19   [provider]  Selenium Sulfide (SELSUN BLUE MEDICATED EX) Apply topically.    [provider]  SELSUN BLUE MOISTURIZING 1 % LOTN Apply topically. 11/29/20   [provider]    Family History History reviewed. No pertinent family history.  Social History Social History   Tobacco Use   Smoking status: Never   Smokeless tobacco: Never  Vaping Use   Vaping Use: Never used  Substance Use Topics   Alcohol use: Never   Drug use: Never     Allergies   Patient has no known allergies.   Review of Systems Review of Systems   Physical Exam Triage Vital Signs ED Triage Vitals  Enc Vitals Group     BP 12/27/21 1330 129/87     Pulse Rate 12/27/21 1330 (!) 105     Resp 12/27/21 1330 20     Temp 12/27/21 1330 98.7 F (37.1 C)     Temp Source 12/27/21 1330 Oral     SpO2 12/27/21 1330 97 %     Weight --      Height --      Head Circumference --      Peak Flow --      Pain Score 12/27/21 1327 0     Pain Loc --      Pain Edu? --  Excl. in GC? --    No data found.  Updated Vital Signs BP 129/87 (BP Location: Left Arm)   Pulse (!) 105   Temp 98.7 F (37.1 C) (Oral)   Resp 20   SpO2 97%   Visual Acuity Right Eye Distance:   Left Eye Distance:   Bilateral Distance:    Right Eye Near:   Left Eye Near:    Bilateral Near:     Physical Exam Constitutional:      General: She is not in acute distress.    Appearance: Normal appearance. She is not ill-appearing.  Pulmonary:     Effort: Pulmonary effort is normal.  Skin:    General: Skin is warm and dry.     Findings: No abscess, bruising, erythema, signs of injury, lesion or rash.          Comments: No palpable lump or lesion, skin is intact in the area of concern.  Nontender.  Neurological:     Mental Status: She is alert.      UC Treatments / Results   Labs (all labs ordered are listed, but only abnormal results are displayed) Labs Reviewed - No data to display  EKG   Radiology No results found.  Procedures Procedures (including critical care time)  Medications Ordered in UC Medications - No data to display  Initial Impression / Assessment and Plan / UC Course  I have reviewed the triage vital signs and the nursing notes.  Pertinent labs & imaging results that were available during my care of the patient were reviewed by me and considered in my medical decision making (see chart for details).    Although tissue around bilateral knees is asymmetrical with the right being greater than the left, I do not see any sign of infection, cyst, lipoma.  Patient to follow-up with PCP  Final Clinical Impressions(s) / UC Diagnoses   Final diagnoses:  Soft tissue complaint     Discharge Instructions      It is not an emergency. Adithi's primary care provider and keep an eye on it.    ED Prescriptions   None    PDMP not reviewed this encounter.   Cathlyn Parsons, NP 12/27/21 [phone]

## 2022-04-26 ENCOUNTER — Encounter: Payer: Self-pay | Admitting: Podiatrist

## 2022-04-26 ENCOUNTER — Ambulatory Visit (INDEPENDENT_AMBULATORY_CARE_PROVIDER_SITE_OTHER): Payer: Medicare Other | Admitting: Podiatrist

## 2022-04-26 DIAGNOSIS — M79675 Pain in left toe(s): Secondary | ICD-10-CM | POA: Diagnosis not present

## 2022-04-26 DIAGNOSIS — B351 Tinea unguium: Secondary | ICD-10-CM | POA: Diagnosis not present

## 2022-04-26 DIAGNOSIS — M79674 Pain in right toe(s): Secondary | ICD-10-CM | POA: Diagnosis not present

## 2022-04-26 DIAGNOSIS — B353 Tinea pedis: Secondary | ICD-10-CM

## 2022-04-26 MED ORDER — KETOCONAZOLE 2 % EX CREA: TOPICAL_CREAM | CUTANEOUS | 2 refills | Status: DC

## 2022-04-26 NOTE — Progress Notes (Signed)
Subjective: Andrea Frank is a 41 y.o. female patient who presents to office today with concern of long,mildly painful nails  while ambulating in shoes; unable to trim.  Patient denies any new cramping, numbness, burning or tingling in the legs or feet. She relates she also has calluses on toes 1 and 5 bilateral, and her fungus is present on the right 4th webspace- left is OK.   Patient Active Problem List   Diagnosis Date Noted   Pain in joint, ankle and foot 10/23/2012   Onychomycosis due to dermatophyte 10/23/2012   Current Outpatient Medications on File Prior to Visit  Medication Sig Dispense Refill   Calcium Carb-Cholecalciferol (OYSTER SHELL CALCIUM W/D) 500-200 MG-UNIT TABS Take 1 tablet by mouth 2 (two) times daily.     ciprofloxacin (CIPRO) 250 MG tablet Take by mouth daily as needed.     clindamycin (CLEOCIN) 150 MG capsule Take by mouth daily as needed.     Ergocalciferol (VITAMIN D2 PO) Take by mouth. 2 day weekly     medroxyPROGESTERone (DEPO-PROVERA) 150 MG/ML injection Inject 150 mg into the muscle every 3 (three) months.      medroxyPROGESTERone Acetate 150 MG/ML SUSY Inject 1 mL into the muscle every 3 (three) months.     Miconazole Nitrate (LOTRIMIN AF) 2 % AERO Spray between toes once daily. 150 g 3   NEUTROGENA T/GEL 0.5 % shampoo Apply topically.     REFRESH RELIEVA 0.5-0.9 % ophthalmic solution SMARTSIG:In Eye(s)     RESTASIS 0.05 % ophthalmic emulsion      Selenium Sulfide (SELSUN BLUE MEDICATED EX) Apply topically.     SELSUN BLUE MOISTURIZING 1 % LOTN Apply topically.     No current facility-administered medications on file prior to visit.   No Known Allergies   Objective: General: Patient is awake, alert, and oriented x 3 and in no acute distress.  Integument: Skin is warm, dry and supple bilateral. Webspace tinea pedis present fourth interspace right. Callus plantar fifth toes and medial great toes noted bialteral. Nails are tender, long, thickened and   dystrophic with subungual debris, consistent with onychomycosis, 1-5 bilateral. No signs of infection. No open lesions or preulcerative lesions present bilateral. Remaining integument unremarkable.  Vasculature:  Dorsalis Pedis pulse 2/4 bilateral. Posterior Tibial pulse  1/4 bilateral.  Capillary fill time <3 sec 1-5 bilateral. Positive hair growth to the level of the digits. Temperature gradient within normal limits. No varicosities present bilateral. No edema present bilateral.   Neurology: The patient has intact sensation measured with a 5.07/10g Semmes Weinstein Monofilament at all pedal sites bilateral . Vibratory sensation diminished bilateral with tuning fork. No Babinski sign present bilateral.   Musculoskeletal: No symptomatic pedal deformities noted bilateral. Muscular strength 5/5 in all lower extremity muscular groups bilateral without pain on range of motion . No tenderness with calf compression bilateral.  Assessment and Plan: Problem List Items Addressed This Visit   None Visit Diagnoses     Pain due to onychomycosis of toenails of both feet    -  Primary   Relevant Medications   ketoconazole (NIZORAL) 2 % cream   Tinea pedis of both feet       Relevant Medications   ketoconazole (NIZORAL) 2 % cream       -Examined patient. Rx for ketoconazole written and instructions written for use.  -Mechanically debrided all nails 1-5 bilateral using sterile nail nipper and filed with sterile dremel without incident  -Answered all patient questions -Patient to return  in 3 months for continued foot care.  -Patient advised to call the office if any problems or questions arise in the meantime.  Delories Heinz, DPM

## 2022-08-15 ENCOUNTER — Ambulatory Visit: Payer: Medicare Other | Admitting: Podiatry

## 2022-08-16 ENCOUNTER — Encounter: Payer: Self-pay | Admitting: Podiatry

## 2022-08-16 ENCOUNTER — Ambulatory Visit (INDEPENDENT_AMBULATORY_CARE_PROVIDER_SITE_OTHER): Payer: 59 | Admitting: Podiatry

## 2022-08-16 VITALS — BP 135/78

## 2022-08-16 DIAGNOSIS — B351 Tinea unguium: Secondary | ICD-10-CM

## 2022-08-16 DIAGNOSIS — M79674 Pain in right toe(s): Secondary | ICD-10-CM

## 2022-08-16 DIAGNOSIS — M79675 Pain in left toe(s): Secondary | ICD-10-CM | POA: Diagnosis not present

## 2022-08-16 NOTE — Progress Notes (Unsigned)
  Subjective:  Patient ID: Andrea Frank, female    DOB: [birth year],  MRN: [id number]  Andrea Frank presents to clinic today for {jgcomplaint:23593}  Chief Complaint  Patient presents with   Nail Problem    [facility] PCP-Betti Reese PCP VST-06/2022   New problem(s): None. {jgcomplaint:23593}  PCP is Lin Landsman, MD.  No Known Allergies  Review of Systems: Negative except as noted in the HPI.  Objective: No changes noted in today's physical examination. Vitals:   08/16/22 1419  BP: 135/78   Andrea Frank is a pleasant 42 y.o. female {jgbodyhabitus:24098} AAO x 3.  Vascular CFT immediate b/l LE. Palpable DP/PT pulses b/l LE. Digital hair present b/l. Skin temperature gradient WNL b/l. No pain with calf compression b/l. No edema noted b/l. No cyanosis or clubbing noted b/l LE.  Neurologic Normal speech. Oriented to person, place, and time. Protective sensation intact 5/5 intact bilaterally with 10g monofilament b/l. Vibratory sensation intact b/l.  Dermatologic Pedal skin is warm and supple b/l LE. No open wounds b/l LE.Interdigital maceration noted bilateral 4th and bilateral 5th webspace(s). No blistering, no weeping, no open wounds.  Toenails 1-5 b/l elongated, discolored, dystrophic, thickened, crumbly with subungual debris and tenderness to dorsal palpation.   Hyperkeratotic lesion(s) bilateral great toes.  No erythema, no edema, no drainage, no fluctuance.  Orthopedic: Muscle strength 5/5 to all lower extremity muscle groups bilaterally. No pain, crepitus or joint limitation noted with ROM bilateral LE. HAV with bunion deformity noted b/l LE. Patient ambulates independent of any assistive aids.   Radiographs: None  Assessment/Plan: 1. Pain due to onychomycosis of toenails of both feet     No orders of the defined types were placed in this encounter.   None {Jgplan:23602::"-Patient/POA to call should there be question/concern in the interim."}   Return in about 3 months (around  11/14/2022).  Marzetta Board, DPM

## 2022-11-23 DIAGNOSIS — Z13220 Encounter for screening for lipoid disorders: Secondary | ICD-10-CM | POA: Diagnosis not present

## 2022-11-23 DIAGNOSIS — Z13228 Encounter for screening for other metabolic disorders: Secondary | ICD-10-CM | POA: Diagnosis not present

## 2022-12-11 ENCOUNTER — Encounter: Payer: Self-pay | Admitting: Podiatry

## 2022-12-11 ENCOUNTER — Ambulatory Visit (INDEPENDENT_AMBULATORY_CARE_PROVIDER_SITE_OTHER): Payer: 59 | Admitting: Podiatry

## 2022-12-11 DIAGNOSIS — M79675 Pain in left toe(s): Secondary | ICD-10-CM

## 2022-12-11 DIAGNOSIS — M79674 Pain in right toe(s): Secondary | ICD-10-CM | POA: Diagnosis not present

## 2022-12-11 DIAGNOSIS — B351 Tinea unguium: Secondary | ICD-10-CM | POA: Diagnosis not present

## 2022-12-11 NOTE — Progress Notes (Signed)

## 2022-12-13 ENCOUNTER — Ambulatory Visit: Payer: 59 | Admitting: Podiatry

## 2023-01-23 ENCOUNTER — Other Ambulatory Visit: Payer: Self-pay | Admitting: Internal Medicine

## 2023-01-23 DIAGNOSIS — E559 Vitamin D deficiency, unspecified: Secondary | ICD-10-CM | POA: Diagnosis not present

## 2023-01-23 DIAGNOSIS — L700 Acne vulgaris: Secondary | ICD-10-CM | POA: Diagnosis not present

## 2023-01-23 DIAGNOSIS — Z0000 Encounter for general adult medical examination without abnormal findings: Secondary | ICD-10-CM | POA: Diagnosis not present

## 2023-01-23 DIAGNOSIS — Z13220 Encounter for screening for lipoid disorders: Secondary | ICD-10-CM | POA: Diagnosis not present

## 2023-01-23 DIAGNOSIS — Z131 Encounter for screening for diabetes mellitus: Secondary | ICD-10-CM | POA: Diagnosis not present

## 2023-02-20 DIAGNOSIS — R4702 Dysphasia: Secondary | ICD-10-CM | POA: Diagnosis not present

## 2023-04-12 ENCOUNTER — Ambulatory Visit (INDEPENDENT_AMBULATORY_CARE_PROVIDER_SITE_OTHER): Payer: 59 | Admitting: Podiatry

## 2023-04-12 ENCOUNTER — Encounter: Payer: Self-pay | Admitting: Podiatry

## 2023-04-12 VITALS — Ht 61.0 in | Wt 180.2 lb

## 2023-04-12 DIAGNOSIS — B351 Tinea unguium: Secondary | ICD-10-CM

## 2023-04-12 DIAGNOSIS — M79674 Pain in right toe(s): Secondary | ICD-10-CM

## 2023-04-12 DIAGNOSIS — M79675 Pain in left toe(s): Secondary | ICD-10-CM | POA: Diagnosis not present

## 2023-04-12 NOTE — Progress Notes (Signed)

## 2023-05-15 ENCOUNTER — Ambulatory Visit: Payer: 59 | Admitting: Obstetrics and Gynecology

## 2023-06-28 ENCOUNTER — Other Ambulatory Visit (HOSPITAL_COMMUNITY)
Admission: RE | Admit: 2023-06-28 | Discharge: 2023-06-28 | Disposition: A | Discharge status: Home / Self Care | Payer: 59 | Source: Ambulatory Visit | Attending: Obstetrics and Gynecology | Admitting: Obstetrics and Gynecology

## 2023-06-28 ENCOUNTER — Ambulatory Visit: Payer: 59 | Admitting: Obstetrics and Gynecology

## 2023-06-28 ENCOUNTER — Encounter: Payer: Self-pay | Admitting: Obstetrics and Gynecology

## 2023-06-28 VITALS — BP 132/84 | HR 108 | Wt 193.4 lb

## 2023-06-28 DIAGNOSIS — Z01419 Encounter for gynecological examination (general) (routine) without abnormal findings: Secondary | ICD-10-CM | POA: Insufficient documentation

## 2023-06-28 DIAGNOSIS — Z1151 Encounter for screening for human papillomavirus (HPV): Secondary | ICD-10-CM | POA: Insufficient documentation

## 2023-06-28 NOTE — Progress Notes (Signed)
 GYNECOLOGY ANNUAL PREVENTATIVE CARE ENCOUNTER NOTE  History:     Andrea Frank is a 43 y.o. No obstetric history on file. female here for a routine annual gynecologic exam.  Current complaints: none.   Denies abnormal vaginal bleeding, discharge, pelvic pain, problems with intercourse or other gynecologic concerns.   Pt has some mental delay.  Chart review shows use of depo provera to provide amenorrhea   Gynecologic History No LMP recorded. Patient has had an injection. Contraception: Depo-Provera injections Last Pap: 2015. Results were: normal with negative HPV Last mammogram:2020. Results were: normal  Obstetric History OB History  No obstetric history on file.    History reviewed. No pertinent past medical history.  History reviewed. No pertinent surgical history.  Current Outpatient Medications on File Prior to Visit  Medication Sig Dispense Refill   Calcium Carb-Cholecalciferol (OYSTER SHELL CALCIUM W/D) 500-200 MG-UNIT TABS Take 1 tablet by mouth 2 (two) times daily.     ketoconazole  (NIZORAL ) 2 % cream Apply between toes 4/5 of right foot every day for 3-4 weeks or until symptoms resolve. 60 g 2   medroxyPROGESTERone (DEPO-PROVERA) 150 MG/ML injection Inject 150 mg into the muscle every 3 (three) months.      Miconazole Nitrate (LOTRIMIN  AF) 2 % AERO Spray between toes once daily. 150 g 3   REFRESH RELIEVA 0.5-0.9 % ophthalmic solution SMARTSIG:In Eye(s)     RESTASIS 0.05 % ophthalmic emulsion      SELSUN BLUE MOISTURIZING 1 % LOTN Apply topically.     ciprofloxacin (CIPRO) 250 MG tablet Take by mouth daily as needed. (Patient not taking: Reported on 06/28/2023)     clindamycin (CLEOCIN) 150 MG capsule Take by mouth daily as needed. (Patient not taking: Reported on 06/28/2023)     Ergocalciferol  (VITAMIN D2 PO) Take by mouth. 2 day weekly (Patient not taking: Reported on 06/28/2023)     medroxyPROGESTERone Acetate 150 MG/ML SUSY Inject 1 mL into the muscle every 3 (three)  months. (Patient not taking: Reported on 06/28/2023)     NEUTROGENA T/GEL 0.5 % shampoo Apply topically. (Patient not taking: Reported on 06/28/2023)     Selenium Sulfide (SELSUN BLUE MEDICATED EX) Apply topically. (Patient not taking: Reported on 06/28/2023)     No current facility-administered medications on file prior to visit.    No Known Allergies  Social History:  reports that she has never smoked. She has never used smokeless tobacco. She reports that she does not drink alcohol and does not use drugs.  History reviewed. No pertinent family history.  The following portions of the patient's history were reviewed and updated as appropriate: allergies, current medications, past family history, past medical history, past social history, past surgical history and problem list.  Review of Systems Pertinent items noted in HPI and remainder of comprehensive ROS otherwise negative.  Physical Exam:  BP 132/84 (BP Location: Left Arm, Cuff Size: Large)   Pulse (!) 108   Wt 193 lb 6.4 oz (87.7 kg)   BMI 36.54 kg/m  CONSTITUTIONAL: Well-developed, obese, well-nourished female in no acute distress.  HENT:  Normocephalic, atraumatic, External right and left ear normal. Oropharynx is clear and moist EYES: Conjunctivae and EOM are normal.  NECK: Normal range of motion, supple, no masses.  Normal thyroid .  SKIN: Skin is warm and dry. No rash noted. Not diaphoretic. No erythema. No pallor. MUSCULOSKELETAL: Normal range of motion. No tenderness.  No cyanosis, clubbing, or edema.  2+ distal pulses. NEUROLOGIC: Alert and oriented to person,  place, and time. Normal reflexes, muscle tone coordination.  PSYCHIATRIC: Normal mood and affect. Normal behavior. Normal judgment and thought content. CARDIOVASCULAR: Normal heart rate noted, regular rhythm RESPIRATORY: Clear to auscultation bilaterally. Effort and breath sounds normal, no problems with respiration noted. BREASTS: deferred ABDOMEN: Soft, no  distention noted.  No tenderness, rebound or guarding.  PELVIC: Normal appearing external genitalia and urethral meatus; normal appearing vaginal mucosa and cervix.  No abnormal discharge noted.  Pap smear obtained.  Possible blind swab.  Exam difficult due to body habitus, extreme anteversion and somewhat poor patient participation.  Normal uterine size, no other palpable masses, no uterine or adnexal tenderness.  Performed in the presence of a chaperone.   Assessment and Plan:    1. Women's annual routine gynecological examination (Primary) Normal annual exam, amenorrhea due to depo provera. - Cytology - PAP( [facility])  Will follow up results of pap smear and manage accordingly. Mammogram scheduled Routine preventative health maintenance measures emphasized. Please refer to After Visit Summary for other counseling recommendations.      Jerilynn Buddle, MD, FACOG Obstetrician & Gynecologist, [facility] for [facility], [facility]

## 2023-07-02 LAB — CYTOLOGY - PAP
Comment: NEGATIVE
Diagnosis: NEGATIVE
High risk HPV: NEGATIVE

## 2023-07-16 ENCOUNTER — Encounter: Payer: Self-pay | Admitting: Podiatry

## 2023-07-16 ENCOUNTER — Ambulatory Visit (INDEPENDENT_AMBULATORY_CARE_PROVIDER_SITE_OTHER): Payer: 59 | Admitting: Podiatry

## 2023-07-16 DIAGNOSIS — M79675 Pain in left toe(s): Secondary | ICD-10-CM

## 2023-07-16 DIAGNOSIS — B351 Tinea unguium: Secondary | ICD-10-CM | POA: Diagnosis not present

## 2023-07-16 DIAGNOSIS — M79674 Pain in right toe(s): Secondary | ICD-10-CM | POA: Diagnosis not present

## 2023-07-16 NOTE — Progress Notes (Signed)

## 2023-07-17 ENCOUNTER — Other Ambulatory Visit: Payer: Self-pay | Admitting: Obstetrics and Gynecology

## 2023-07-17 DIAGNOSIS — Z1231 Encounter for screening mammogram for malignant neoplasm of breast: Secondary | ICD-10-CM

## 2023-10-15 ENCOUNTER — Ambulatory Visit (INDEPENDENT_AMBULATORY_CARE_PROVIDER_SITE_OTHER): Payer: 59 | Admitting: Podiatry

## 2023-10-15 ENCOUNTER — Encounter: Payer: Self-pay | Admitting: Podiatry

## 2023-10-15 DIAGNOSIS — M79675 Pain in left toe(s): Secondary | ICD-10-CM | POA: Diagnosis not present

## 2023-10-15 DIAGNOSIS — B351 Tinea unguium: Secondary | ICD-10-CM | POA: Diagnosis not present

## 2023-10-15 DIAGNOSIS — M79674 Pain in right toe(s): Secondary | ICD-10-CM | POA: Diagnosis not present

## 2023-10-15 NOTE — Progress Notes (Signed)

## 2023-11-14 ENCOUNTER — Other Ambulatory Visit: Payer: Self-pay | Admitting: Podiatrist

## 2023-12-27 ENCOUNTER — Ambulatory Visit

## 2023-12-28 ENCOUNTER — Ambulatory Visit
Admission: RE | Admit: 2023-12-28 | Discharge: 2023-12-28 | Disposition: A | Discharge status: Home / Self Care | Source: Ambulatory Visit | Attending: Obstetrics and Gynecology | Admitting: Obstetrics and Gynecology

## 2023-12-28 DIAGNOSIS — Z1231 Encounter for screening mammogram for malignant neoplasm of breast: Secondary | ICD-10-CM

## 2024-01-02 ENCOUNTER — Ambulatory Visit: Payer: Self-pay | Admitting: Obstetrics and Gynecology

## 2024-01-03 ENCOUNTER — Other Ambulatory Visit: Payer: Self-pay | Admitting: Obstetrics and Gynecology

## 2024-01-03 DIAGNOSIS — R928 Other abnormal and inconclusive findings on diagnostic imaging of breast: Secondary | ICD-10-CM

## 2024-01-15 ENCOUNTER — Ambulatory Visit (INDEPENDENT_AMBULATORY_CARE_PROVIDER_SITE_OTHER): Admitting: Podiatry

## 2024-01-15 ENCOUNTER — Encounter: Payer: Self-pay | Admitting: Podiatry

## 2024-01-15 DIAGNOSIS — M79674 Pain in right toe(s): Secondary | ICD-10-CM | POA: Diagnosis not present

## 2024-01-15 DIAGNOSIS — B351 Tinea unguium: Secondary | ICD-10-CM

## 2024-01-15 DIAGNOSIS — M79675 Pain in left toe(s): Secondary | ICD-10-CM | POA: Diagnosis not present

## 2024-01-15 NOTE — Progress Notes (Signed)

## 2024-02-28 ENCOUNTER — Ambulatory Visit
Admission: RE | Admit: 2024-02-28 | Discharge: 2024-02-28 | Disposition: A | Discharge status: Home / Self Care | Source: Ambulatory Visit | Attending: Obstetrics and Gynecology | Admitting: Obstetrics and Gynecology

## 2024-02-28 ENCOUNTER — Ambulatory Visit
Admission: RE | Admit: 2024-02-28 | Discharge: 2024-02-28 | Disposition: A | Discharge status: Home / Self Care | Source: Ambulatory Visit | Attending: Obstetrics and Gynecology

## 2024-02-28 DIAGNOSIS — R928 Other abnormal and inconclusive findings on diagnostic imaging of breast: Secondary | ICD-10-CM

## 2024-03-05 ENCOUNTER — Ambulatory Visit: Payer: Self-pay | Admitting: Obstetrics and Gynecology

## 2024-04-17 ENCOUNTER — Ambulatory Visit (INDEPENDENT_AMBULATORY_CARE_PROVIDER_SITE_OTHER): Admitting: Podiatry

## 2024-04-17 ENCOUNTER — Encounter: Payer: Self-pay | Admitting: Podiatry

## 2024-04-17 DIAGNOSIS — B351 Tinea unguium: Secondary | ICD-10-CM | POA: Diagnosis not present

## 2024-04-17 DIAGNOSIS — M79675 Pain in left toe(s): Secondary | ICD-10-CM

## 2024-04-17 DIAGNOSIS — M79674 Pain in right toe(s): Secondary | ICD-10-CM

## 2024-04-17 NOTE — Progress Notes (Addendum)

## 2024-07-18 ENCOUNTER — Ambulatory Visit: Admitting: Podiatry

## 2024-08-15 ENCOUNTER — Ambulatory Visit: Admitting: Podiatry
